# Patient Record
Sex: Male | Born: 1985 | Race: Black or African American | Hispanic: No | Marital: Single | Smoking: Current every day smoker
Health system: Southern US, Community
[De-identification: ages and names within clinical notes are randomized; demographics above are authoritative.]

## PROBLEM LIST (undated history)

## (undated) DIAGNOSIS — F329 Major depressive disorder, single episode, unspecified: Secondary | ICD-10-CM

## (undated) DIAGNOSIS — F909 Attention-deficit hyperactivity disorder, unspecified type: Secondary | ICD-10-CM

## (undated) DIAGNOSIS — F319 Bipolar disorder, unspecified: Secondary | ICD-10-CM

## (undated) DIAGNOSIS — F32A Depression, unspecified: Secondary | ICD-10-CM

## (undated) DIAGNOSIS — C9590 Leukemia, unspecified not having achieved remission: Secondary | ICD-10-CM

## (undated) DIAGNOSIS — F209 Schizophrenia, unspecified: Secondary | ICD-10-CM

## (undated) HISTORY — PX: OTHER SURGICAL HISTORY: SHX169

---

## 2011-05-02 ENCOUNTER — Encounter (HOSPITAL_COMMUNITY): Payer: Self-pay | Admitting: *Deleted

## 2011-05-02 ENCOUNTER — Emergency Department (HOSPITAL_COMMUNITY)
Admission: EM | Admit: 2011-05-02 | Discharge: 2011-05-05 | Disposition: A | Discharge status: Other Acute Inpatient Hospital | Payer: Self-pay | Attending: Emergency Medicine | Admitting: Emergency Medicine

## 2011-05-02 DIAGNOSIS — R45850 Homicidal ideations: Secondary | ICD-10-CM | POA: Insufficient documentation

## 2011-05-02 DIAGNOSIS — F319 Bipolar disorder, unspecified: Secondary | ICD-10-CM | POA: Insufficient documentation

## 2011-05-02 DIAGNOSIS — R45851 Suicidal ideations: Secondary | ICD-10-CM | POA: Insufficient documentation

## 2011-05-02 DIAGNOSIS — F259 Schizoaffective disorder, unspecified: Secondary | ICD-10-CM | POA: Insufficient documentation

## 2011-05-02 DIAGNOSIS — R440 Auditory hallucinations: Secondary | ICD-10-CM

## 2011-05-02 HISTORY — DX: Schizophrenia, unspecified: F20.9

## 2011-05-02 HISTORY — DX: Bipolar disorder, unspecified: F31.9

## 2011-05-02 HISTORY — DX: Major depressive disorder, single episode, unspecified: F32.9

## 2011-05-02 HISTORY — DX: Leukemia, unspecified not having achieved remission: C95.90

## 2011-05-02 HISTORY — DX: Depression, unspecified: F32.A

## 2011-05-02 HISTORY — DX: Attention-deficit hyperactivity disorder, unspecified type: F90.9

## 2011-05-02 LAB — DIFFERENTIAL
Basophils Absolute: 0 10*3/uL (ref 0.0–0.1)
Basophils Relative: 0 % (ref 0–1)
Eosinophils Absolute: 0.1 10*3/uL (ref 0.0–0.7)
Eosinophils Relative: 1 % (ref 0–5)
Lymphocytes Relative: 28 % (ref 12–46)

## 2011-05-02 LAB — BASIC METABOLIC PANEL
Calcium: 9.6 mg/dL (ref 8.4–10.5)
GFR calc Af Amer: >90 mL/min (ref >90)
GFR calc non Af Amer: >90 mL/min (ref >90)
Sodium: 138 mEq/L (ref 135–145)

## 2011-05-02 LAB — URINALYSIS, ROUTINE W REFLEX MICROSCOPIC
Glucose, UA: NEGATIVE mg/dL
Hgb urine dipstick: NEGATIVE
Leukocytes, UA: NEGATIVE
Specific Gravity, Urine: 1.028 (ref 1.005–1.030)

## 2011-05-02 LAB — ETHANOL: Alcohol, Ethyl (B): <11 mg/dL (ref 0–11)

## 2011-05-02 LAB — CBC
MCH: 31.8 pg (ref 26.0–34.0)
MCV: 92.3 fL (ref 78.0–100.0)
Platelets: 331 10*3/uL (ref 150–400)
RDW: 12.6 % (ref 11.5–15.5)
WBC: 12.7 10*3/uL — ABNORMAL HIGH (ref 4.0–10.5)

## 2011-05-02 LAB — RAPID URINE DRUG SCREEN, HOSP PERFORMED: Opiates: NOT DETECTED

## 2011-05-02 MED ORDER — OXYCODONE-ACETAMINOPHEN 5-325 MG PO TABS
2.0000 | ORAL_TABLET | Freq: Once | ORAL | Status: AC
  Administered 2011-05-02: 2 via ORAL
  Filled 2011-05-02: qty 2

## 2011-05-02 MED ORDER — ONDANSETRON HCL 4 MG PO TABS: 4.0000 mg | ORAL_TABLET | Freq: Three times a day (TID) | ORAL | Status: DC | PRN

## 2011-05-02 MED ORDER — HYDROCORTISONE ACETATE 25 MG RE SUPP
25.0000 mg | Freq: Two times a day (BID) | RECTAL | Status: DC
  Administered 2011-05-02 – 2011-05-05 (×5): 25 mg via RECTAL
  Filled 2011-05-02 (×8): qty 1

## 2011-05-02 MED ORDER — ZOLPIDEM TARTRATE 5 MG PO TABS
5.0000 mg | ORAL_TABLET | Freq: Every evening | ORAL | Status: DC | PRN
  Administered 2011-05-02 – 2011-05-04 (×3): 5 mg via ORAL
  Filled 2011-05-02 (×3): qty 1

## 2011-05-02 MED ORDER — LORAZEPAM 1 MG PO TABS: 1.0000 mg | ORAL_TABLET | Freq: Three times a day (TID) | ORAL | Status: DC | PRN

## 2011-05-02 MED ORDER — IBUPROFEN 600 MG PO TABS
600.0000 mg | ORAL_TABLET | Freq: Three times a day (TID) | ORAL | Status: DC | PRN
  Administered 2011-05-03 – 2011-05-04 (×2): 600 mg via ORAL
  Filled 2011-05-02 (×2): qty 1

## 2011-05-02 MED ORDER — ACETAMINOPHEN 325 MG PO TABS: 650.0000 mg | ORAL_TABLET | ORAL | Status: DC | PRN

## 2011-05-02 MED ORDER — RISPERIDONE 0.5 MG PO TBDP
0.5000 mg | ORAL_TABLET | Freq: Two times a day (BID) | ORAL | Status: DC
  Administered 2011-05-02 – 2011-05-03 (×3): 0.5 mg via ORAL
  Filled 2011-05-02 (×4): qty 1

## 2011-05-02 MED ORDER — NICOTINE 21 MG/24HR TD PT24
21.0000 mg | MEDICATED_PATCH | Freq: Every day | TRANSDERMAL | Status: DC
  Administered 2011-05-03 – 2011-05-05 (×3): 21 mg via TRANSDERMAL
  Filled 2011-05-02 (×4): qty 1

## 2011-05-02 MED ORDER — ALUM & MAG HYDROXIDE-SIMETH 200-200-20 MG/5ML PO SUSP: 30.0000 mL | ORAL | Status: DC | PRN

## 2011-05-02 NOTE — ED Notes (Signed)
Pt states "I have untreated bipolar disorder, I started sporadically taking the medicine and then just stopped"; friend with pt states "he was having violent thoughts"

## 2011-05-02 NOTE — ED Notes (Signed)
AC called for sitter. Non-available at present. Preparing patient transfer to psych ED

## 2011-05-02 NOTE — Consult Note (Signed)
Reason for Consult: Psychosis, suicidal and homicidal ideations Referring Physician: Dr. Heron Sabins is an 26 y.o. male.  HPI:  This is a 26 years old young male presented to the [facility] emergency department psychiatric services with the chief complaints of command hallucinations telling him to kill himself, his girlfriend and the years old child. Patient reported he has a past history of for schizoaffective disorder bipolar disorder and was not on medications for the last 6 years. Patient reported he was admitted to the [facility] in [place] for 3 times and [facility] while living in [place] about 5-6 years ago. Patient reported he was also diagnosed with attention deficit hyperactivity disorder and taking Adderall and Wellbutrin in the past. 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 Patient the has a history of using acid, cocaine, ecstasy and marijuana abuse. Patient to drinks alcohol rarely. His toxicology shows marijuana. Patient reported his friend Riki Rusk and his girlfriend brought him up when he's been out of control and acting out at home. Patient did stated that he does not want kill he want to control himself but he cannot control himself at this time and splitting for the help.   Past Medical History  Diagnosis Date  . Bipolar disorder   . ADHD (attention deficit hyperactivity disorder)   . Schizophrenia   . Depression   . Leukemia     Past Surgical History  Procedure Date  . Bone marrow transplant     No family history on file.  Social History:  reports that he has been smoking.  He does not have any smokeless tobacco history on file. He reports that he drinks alcohol. He reports that he uses illicit drugs (Marijuana) about 3 times per week.  Allergies:  No Known Allergies  Medications: I have reviewed the patient's current medications.  Results for orders placed during the hospital encounter of 05/02/11 (from the past 48 hour(s))  CBC     Status: Abnormal   Collection Time   05/02/11  9:04 AM      Component Value Range Comment   WBC 12.7 (*) 4.0 - 10.5 (K/uL)    RBC 4.78  4.22 - 5.81 (MIL/uL)    Hemoglobin 15.2  13.0 - 17.0 (g/dL)    HCT 16.1  09.6 - 04.5 (%)    MCV 92.3  78.0 - 100.0 (fL)    MCH 31.8  26.0 - 34.0 (pg)    MCHC 34.5  30.0 - 36.0 (g/dL)    RDW 40.9  81.1 - 91.4 (%)    Platelets 331  150 - 400 (K/uL)   DIFFERENTIAL     Status: Abnormal   Collection Time   05/02/11  9:04 AM      Component Value Range Comment   Neutrophils Relative 65  43 - 77 (%)    Neutro Abs 8.2 (*) 1.7 - 7.7 (K/uL)    Lymphocytes Relative 28  12 - 46 (%)    Lymphs Abs 3.5  0.7 - 4.0 (K/uL)    Monocytes Relative 6  3 - 12 (%)    Monocytes Absolute 0.8  0.1 - 1.0 (K/uL)    Eosinophils Relative 1  0 - 5 (%)    Eosinophils Absolute 0.1  0.0 - 0.7 (K/uL)  Basophils Relative 0  0 - 1 (%)    Basophils Absolute 0.0  0.0 - 0.1 (K/uL)   BASIC METABOLIC PANEL     Status: Abnormal   Collection Time   05/02/11  9:04 AM      Component Value Range Comment   Sodium 138  135 - 145 (mEq/L)    Potassium 3.8  3.5 - 5.1 (mEq/L)    Chloride 102  96 - 112 (mEq/L)    CO2 26  19 - 32 (mEq/L)    Glucose, Bld 104 (*) 70 - 99 (mg/dL)    BUN 13  6 - 23 (mg/dL)    Creatinine, Ser 1.61  0.50 - 1.35 (mg/dL)    Calcium 9.6  8.4 - 10.5 (mg/dL)    GFR calc non Af Amer >90  >90 (mL/min)    GFR calc Af Amer >90  >90 (mL/min)   ETHANOL     Status: Normal   Collection Time   05/02/11  9:04 AM      Component Value Range Comment   Alcohol, Ethyl (B) <11  0 - 11 (mg/dL)   URINE RAPID DRUG SCREEN (HOSP PERFORMED)     Status: Abnormal   Collection Time   05/02/11  9:15 AM      Component Value Range Comment   Opiates NONE DETECTED  NONE DETECTED     Cocaine NONE DETECTED   NONE DETECTED     Benzodiazepines NONE DETECTED  NONE DETECTED     Amphetamines NONE DETECTED  NONE DETECTED     Tetrahydrocannabinol POSITIVE (*) NONE DETECTED     Barbiturates NONE DETECTED  NONE DETECTED    URINALYSIS, ROUTINE W REFLEX MICROSCOPIC     Status: Normal   Collection Time   05/02/11  9:15 AM      Component Value Range Comment   Color, Urine YELLOW  YELLOW     APPearance CLEAR  CLEAR     Specific Gravity, Urine 1.028  1.005 - 1.030     pH 6.0  5.0 - 8.0     Glucose, UA NEGATIVE  NEGATIVE (mg/dL)    Hgb urine dipstick NEGATIVE  NEGATIVE     Bilirubin Urine NEGATIVE  NEGATIVE     Ketones, ur NEGATIVE  NEGATIVE (mg/dL)    Protein, ur NEGATIVE  NEGATIVE (mg/dL)    Urobilinogen, UA 0.2  0.0 - 1.0 (mg/dL)    Nitrite NEGATIVE  NEGATIVE     Leukocytes, UA NEGATIVE  NEGATIVE  MICROSCOPIC NOT DONE ON URINES WITH NEGATIVE PROTEIN, BLOOD, LEUKOCYTES, NITRITE, OR GLUCOSE <1000 mg/dL.    No results found.  Positive for aggressive behavior, bad mood, bipolar, illegal drug usage, mood swings and command Auditory hallucinations Blood pressure 118/77, pulse 78, temperature 98.5 F (36.9 C), temperature source Oral, resp. rate 16, SpO2 100.00%.   Assessment/Plan: Psychosis not otherwise specified versus schizoaffective disorder Cannabis abuse versus dependence Polysubstance abuse versus dependence by history  Recommended psychiatric hospitalization for safety and stabilization  Vineeth Fell,JANARDHAHA R. 05/02/2011, 12:07 PM

## 2011-05-02 NOTE — BH Assessment (Signed)
Assessment Note   Nevada Crane, Child psychotherapist at [employer], submitted Pt for consideration for inpatient psychiatric treatment at [facility]. Per Laverle Hobby, AC no appropriate bed is available at this time. Pt placed on pending list. Notified Georgina Quint, assessment counselor at [facility], of disposition.    Patsy Baltimore, Harlin Rain 05/02/2011 8:54 PM

## 2011-05-02 NOTE — ED Notes (Signed)
Pt. States that he was up plaging games until 0400 this morning and just about ready to go to bed when his girlfriend's daughter woke up asking for tissues, pt. States that he got her some tissues and began screaming (which is a trigger for him) the screaming woke up his girlfriend and pt. Began hearing voices telling him to go to the kitchen and get a knife and kill his girlfriend, her daughter  And then kill himself with a knife and banging his head against the wall.  States "wants to kill everyone and self and anyone that would stop him so all his problems will be done with and no one would miss me."  Pt. Showed no emotion throughout discussion.

## 2011-05-02 NOTE — ED Provider Notes (Addendum)
History     CSN: [id number]  Arrival date & time 05/02/11  0865   First MD Initiated Contact with Patient 05/02/11 [phone]      Chief Complaint  Patient presents with  . Medical Clearance    (Consider location/radiation/quality/duration/timing/severity/associated sxs/prior treatment) Patient is a 26 y.o. male presenting with mental health disorder. The history is provided by the patient. No language interpreter was used.  Mental Health Problem The primary symptoms include delusions and hallucinations. The primary symptoms do not include dysphoric mood. The current episode started 1 to 2 weeks ago. This is a new problem.  The delusions began more than 2 weeks ago. The delusions appear to be have been worsening since their onset. He has delusions of persecution, being controlled and audible thoughts.  The hallucinations began more than 2 weeks ago. He has auditory hallucinations.  The onset of the illness is precipitated by emotional stress and drug abuse. The degree of incapacity that he is experiencing as a consequence of his illness is moderate. Sequelae of the illness include an inability to work and an inability to care for self. Additional symptoms of the illness do not include no appetite change, no fatigue, no headaches or no abdominal pain. He admits to suicidal ideas. He does have a plan to commit suicide. He does not contemplate harming himself. He has not already injured self. He contemplates injuring another person. He has not already  injured another person. Risk factors that are present for mental illness include a history of mental illness.    Past Medical History  Diagnosis Date  . Bipolar disorder   . ADHD (attention deficit hyperactivity disorder)   . Schizophrenia   . Depression   . Leukemia     Past Surgical History  Procedure Date  . Bone marrow transplant     No family history on file.  History  Substance Use Topics  . Smoking status: Current Everyday Smoker  -- 0.5 packs/day  . Smokeless tobacco: Not on file  . Alcohol Use: Yes     rarely      Review of Systems  Constitutional: Positive for activity change. Negative for fever, chills, appetite change and fatigue.  HENT: Negative for congestion, sore throat, rhinorrhea, neck pain and neck stiffness.   Respiratory: Negative for cough and shortness of breath.   Cardiovascular: Negative for chest pain and palpitations.  Gastrointestinal: Negative for nausea, vomiting and abdominal pain.  Genitourinary: Negative for dysuria, urgency, frequency and flank pain.  Musculoskeletal: Negative for myalgias, back pain and arthralgias.  Neurological: Negative for dizziness, weakness, light-headedness, numbness and headaches.  Psychiatric/Behavioral: Positive for suicidal ideas and hallucinations. Negative for self-injury and dysphoric mood. The patient is not nervous/anxious.   All other systems reviewed and are negative.    Allergies  Review of patient's allergies indicates no known allergies.  Home Medications   Current Outpatient Rx  Name Route Sig Dispense Refill  . IBUPROFEN 200 MG PO TABS Oral Take 400 mg by mouth every 8 (eight) hours as needed. For pain.      BP 119/75  Pulse 95  Temp(Src) 98.5 F (36.9 C) (Oral)  Resp 20  SpO2 97%  Physical Exam  Nursing note and vitals reviewed. Constitutional: He is oriented to person, place, and time. He appears well-developed and well-nourished. No distress.  HENT:  Head: Normocephalic and atraumatic.  Mouth/Throat: Oropharynx is clear and moist.  Eyes: Conjunctivae and EOM are normal. Pupils are equal, round, and reactive to light.  Neck: Normal range of motion. Neck supple.  Cardiovascular: Normal rate, regular rhythm, normal heart sounds and intact distal pulses.  Exam reveals no gallop and no friction rub.   No murmur heard. Pulmonary/Chest: Effort normal and breath sounds normal. No respiratory distress. He exhibits no tenderness.    Abdominal: Soft. Bowel sounds are normal. There is no tenderness. There is no rebound and no guarding.  Musculoskeletal: Normal range of motion. He exhibits no edema and no tenderness.  Neurological: He is alert and oriented to person, place, and time. No cranial nerve deficit.  Skin: Skin is warm and dry. No rash noted.  Psychiatric: His affect is blunt. He is actively hallucinating. Thought content is delusional. He exhibits a depressed mood. He expresses homicidal and suicidal ideation. He expresses suicidal plans and homicidal plans.    ED Course  Procedures (including critical care time)   Labs Reviewed  CBC  DIFFERENTIAL  BASIC METABOLIC PANEL  ETHANOL  URINE RAPID DRUG SCREEN (HOSP PERFORMED)  URINALYSIS, ROUTINE W REFLEX MICROSCOPIC   No results found.   1. Suicidal ideation   2. Homicidal ideation   3. Auditory hallucinations       MDM  [place] auditory hallucinations instructing the patient to harm others and himself. He has specific to liberate plans on how to kill himself and others. This is extremely concerning for the safety of himself and others. Medical screening labs were obtained. He is medically clear for psychiatric evaluation. Discussed with the ACT team. Psychiatric orders were placed. The patient will require admission for psychiatric stabilization.  He is currently here voluntarily.  Called to bedside for painful hemorrhoids.  Examined and there is no thrombosis.  Will prescribe anusol and pain meds        Dayton Bailiff, MD 05/02/11 1610  Dayton Bailiff, MD 05/02/11 1019
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
## 2011-05-02 NOTE — ED Notes (Signed)
Per acute RN, pt.'s girlfriend took pt.'s belongings home.

## 2011-05-03 MED ORDER — BUPROPION HCL ER (XL) 150 MG PO TB24: 75.0000 mg | ORAL_TABLET | Freq: Every day | ORAL | Status: DC

## 2011-05-03 MED ORDER — BUPROPION HCL ER (XL) 150 MG PO TB24
150.0000 mg | ORAL_TABLET | Freq: Every day | ORAL | Status: DC
  Administered 2011-05-03: 150 mg via ORAL
  Filled 2011-05-03 (×2): qty 1

## 2011-05-03 MED ORDER — RISPERIDONE 2 MG PO TBDP
2.0000 mg | ORAL_TABLET | Freq: Two times a day (BID) | ORAL | Status: DC
  Administered 2011-05-03: 2 mg via ORAL
  Filled 2011-05-03 (×4): qty 1
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
## 2011-05-03 NOTE — ED Notes (Signed)
Dr allen into see 

## 2011-05-03 NOTE — ED Notes (Signed)
Up to the desk on the phone 

## 2011-05-03 NOTE — ED Provider Notes (Signed)
Patient presented initially with suicidal and homicidal plans to hurt himself and others. Patient currently denies suicidal ideations. Denies any hallucinations auditory or visual. Patient to have consult by telemetry psychiatry  Toy Baker, MD 05/03/11 [phone]

## 2011-05-03 NOTE — ED Provider Notes (Signed)
telepsych consult complete.  Patient needs inpatient psych stabilization.  Gerhard Munch, MD 05/03/11 [phone]

## 2011-05-03 NOTE — ED Notes (Signed)
Sleeping, easily aroused.  Pt is aware that the telepsy will be done shortly

## 2011-05-03 NOTE — ED Notes (Signed)
telepsych in progress 

## 2011-05-04 MED ORDER — BUPROPION HCL 75 MG PO TABS
75.0000 mg | ORAL_TABLET | Freq: Every day | ORAL | Status: DC
  Administered 2011-05-04 – 2011-05-05 (×2): 75 mg via ORAL
  Filled 2011-05-04 (×2): qty 1

## 2011-05-04 MED ORDER — RISPERIDONE 2 MG PO TABS
2.0000 mg | ORAL_TABLET | Freq: Every day | ORAL | Status: DC
  Administered 2011-05-04: 2 mg via ORAL
  Filled 2011-05-04: qty 1

## 2011-05-04 NOTE — ED Notes (Signed)
Sitting quietly w/ girlfriend watching tv

## 2011-05-04 NOTE — ED Notes (Signed)
Talking quietly w/ girlfriend, watching tv

## 2011-05-04 NOTE — ED Notes (Signed)
Talking quietly w/ girlfriend 
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
## 2011-05-04 NOTE — ED Provider Notes (Signed)
  Physical Exam  BP 127/74  Pulse 65  Temp(Src) 98.3 F (36.8 C) (Oral)  Resp 16  SpO2 99%  Physical Exam  ED Course  Procedures  MDM Telemetry psych consult reviewed. Medications adjusted to comply with the recommendations. Patient is pending behavioral health and possibly the [place]. Patient was sleeping comfortably this morning.      Juliet Rude. Rubin Payor, MD 05/04/11 [phone]

## 2011-05-04 NOTE — ED Notes (Signed)
Patients girlfriend into see

## 2011-05-04 NOTE — ED Notes (Signed)
eating lunch

## 2011-05-05 ENCOUNTER — Inpatient Hospital Stay (HOSPITAL_COMMUNITY)
Admission: AD | Admit: 2011-05-05 | Discharge: 2011-05-09 | DRG: 885 | Disposition: A | Discharge status: Home / Self Care | Payer: PRIVATE HEALTH INSURANCE | Source: Ambulatory Visit | Attending: Psychiatry | Admitting: Psychiatry

## 2011-05-05 ENCOUNTER — Encounter (HOSPITAL_COMMUNITY): Payer: Self-pay

## 2011-05-05 DIAGNOSIS — R45851 Suicidal ideations: Secondary | ICD-10-CM

## 2011-05-05 DIAGNOSIS — F411 Generalized anxiety disorder: Secondary | ICD-10-CM

## 2011-05-05 DIAGNOSIS — R45850 Homicidal ideations: Secondary | ICD-10-CM

## 2011-05-05 DIAGNOSIS — F1210 Cannabis abuse, uncomplicated: Secondary | ICD-10-CM

## 2011-05-05 DIAGNOSIS — F909 Attention-deficit hyperactivity disorder, unspecified type: Secondary | ICD-10-CM | POA: Diagnosis present

## 2011-05-05 DIAGNOSIS — F410 Panic disorder [episodic paroxysmal anxiety] without agoraphobia: Secondary | ICD-10-CM

## 2011-05-05 DIAGNOSIS — F1611 Hallucinogen abuse, in remission: Secondary | ICD-10-CM | POA: Diagnosis present

## 2011-05-05 DIAGNOSIS — F902 Attention-deficit hyperactivity disorder, combined type: Secondary | ICD-10-CM | POA: Diagnosis present

## 2011-05-05 DIAGNOSIS — F259 Schizoaffective disorder, unspecified: Principal | ICD-10-CM | POA: Diagnosis present

## 2011-05-05 DIAGNOSIS — F250 Schizoaffective disorder, bipolar type: Secondary | ICD-10-CM

## 2011-05-05 DIAGNOSIS — F1511 Other stimulant abuse, in remission: Secondary | ICD-10-CM | POA: Diagnosis present

## 2011-05-05 DIAGNOSIS — F1411 Cocaine abuse, in remission: Secondary | ICD-10-CM | POA: Diagnosis present

## 2011-05-05 MED ORDER — NICOTINE 14 MG/24HR TD PT24
MEDICATED_PATCH | TRANSDERMAL | Status: AC
  Administered 2011-05-05: 14 mg via TRANSDERMAL
  Filled 2011-05-05: qty 1

## 2011-05-05 MED ORDER — OXCARBAZEPINE 150 MG PO TABS
150.0000 mg | ORAL_TABLET | Freq: Every day | ORAL | Status: DC
  Administered 2011-05-06: 150 mg via ORAL
  Filled 2011-05-05 (×3): qty 1

## 2011-05-05 MED ORDER — ACETAMINOPHEN 325 MG PO TABS: 650.0000 mg | ORAL_TABLET | Freq: Four times a day (QID) | ORAL | Status: DC | PRN

## 2011-05-05 MED ORDER — HYDROXYZINE HCL 50 MG PO TABS: 50.0000 mg | ORAL_TABLET | Freq: Four times a day (QID) | ORAL | Status: DC | PRN

## 2011-05-05 MED ORDER — TRAZODONE HCL 100 MG PO TABS
100.0000 mg | ORAL_TABLET | Freq: Every day | ORAL | Status: DC
  Administered 2011-05-05 – 2011-05-08 (×4): 100 mg via ORAL
  Filled 2011-05-05 (×3): qty 1
  Filled 2011-05-05: qty 7
  Filled 2011-05-05 (×2): qty 1

## 2011-05-05 MED ORDER — NICOTINE 14 MG/24HR TD PT24
14.0000 mg | MEDICATED_PATCH | TRANSDERMAL | Status: DC
  Administered 2011-05-05 – 2011-05-08 (×4): 14 mg via TRANSDERMAL
  Filled 2011-05-05 (×3): qty 1
  Filled 2011-05-05: qty 7
  Filled 2011-05-05: qty 1

## 2011-05-05 MED ORDER — MAGNESIUM HYDROXIDE 400 MG/5ML PO SUSP: 30.0000 mL | Freq: Every day | ORAL | Status: DC | PRN

## 2011-05-05 MED ORDER — ALUM & MAG HYDROXIDE-SIMETH 200-200-20 MG/5ML PO SUSP: 30.0000 mL | ORAL | Status: DC | PRN

## 2011-05-05 MED ORDER — QUETIAPINE FUMARATE 200 MG PO TABS
200.0000 mg | ORAL_TABLET | Freq: Every day | ORAL | Status: DC
  Filled 2011-05-05 (×2): qty 1

## 2011-05-05 NOTE — Tx Team (Signed)
Initial Interdisciplinary Treatment Plan  PATIENT STRENGTHS: (choose at least two) Average or above average intelligence General fund of knowledge  PATIENT STRESSORS: Financial difficulties Mental Health Disorder   PROBLEM LIST: Problem List/Patient Goals Date to be addressed Date deferred Reason deferred Estimated date of resolution  Psychosis      Homicidal Ideation                                                 DISCHARGE CRITERIA:  Need for constant or close observation no longer present  PRELIMINARY DISCHARGE PLAN: Outpatient therapy  PATIENT/FAMIILY INVOLVEMENT: This treatment plan has been presented to and reviewed with the patient, Anthony Fischer, and/or family member.  The patient and family have been given the opportunity to ask questions and make suggestions.  Anthony Fischer [facility] 05/05/2011, 7:35 PM

## 2011-05-05 NOTE — ED Provider Notes (Signed)
Patient stable here today.  Accepted to behavioral health and being transferred.   Hilario Quarry, MD 05/05/11 [phone]

## 2011-05-05 NOTE — ED Notes (Signed)
Report called to [facility], Charity fundraiser.  BH requested pt. To be sent to them at 1630.  [facility] called for transportation to [facility].
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
## 2011-05-06 DIAGNOSIS — F250 Schizoaffective disorder, bipolar type: Secondary | ICD-10-CM | POA: Diagnosis present

## 2011-05-06 DIAGNOSIS — F259 Schizoaffective disorder, unspecified: Principal | ICD-10-CM

## 2011-05-06 DIAGNOSIS — F410 Panic disorder [episodic paroxysmal anxiety] without agoraphobia: Secondary | ICD-10-CM

## 2011-05-06 DIAGNOSIS — F411 Generalized anxiety disorder: Secondary | ICD-10-CM | POA: Diagnosis present

## 2011-05-06 DIAGNOSIS — F1210 Cannabis abuse, uncomplicated: Secondary | ICD-10-CM | POA: Diagnosis present

## 2011-05-06 DIAGNOSIS — F909 Attention-deficit hyperactivity disorder, unspecified type: Secondary | ICD-10-CM

## 2011-05-06 DIAGNOSIS — F902 Attention-deficit hyperactivity disorder, combined type: Secondary | ICD-10-CM | POA: Diagnosis present

## 2011-05-06 LAB — COMPREHENSIVE METABOLIC PANEL
ALT: 45 U/L (ref 0–53)
Alkaline Phosphatase: 67 U/L (ref 39–117)
CO2: 29 mEq/L (ref 19–32)
GFR calc Af Amer: >90 mL/min (ref >90)
GFR calc non Af Amer: >90 mL/min (ref >90)
Glucose, Bld: 99 mg/dL (ref 70–99)
Potassium: 4.6 mEq/L (ref 3.5–5.1)
Sodium: 140 mEq/L (ref 135–145)

## 2011-05-06 MED ORDER — IBUPROFEN 600 MG PO TABS
600.0000 mg | ORAL_TABLET | Freq: Four times a day (QID) | ORAL | Status: DC | PRN
Start: 2011-05-06 — End: 2011-05-09
  Administered 2011-05-07: 600 mg via ORAL
  Filled 2011-05-06: qty 1

## 2011-05-06 MED ORDER — RISPERIDONE 2 MG PO TABS
2.0000 mg | ORAL_TABLET | Freq: Every day | ORAL | Status: DC
  Administered 2011-05-06 – 2011-05-08 (×3): 2 mg via ORAL
  Filled 2011-05-06 (×5): qty 1
  Filled 2011-05-06: qty 7
  Filled 2011-05-06: qty 1

## 2011-05-06 MED ORDER — OXCARBAZEPINE 150 MG PO TABS
150.0000 mg | ORAL_TABLET | ORAL | Status: DC
  Administered 2011-05-06 – 2011-05-09 (×6): 150 mg via ORAL
  Filled 2011-05-06: qty 1
  Filled 2011-05-06: qty 14
  Filled 2011-05-06 (×4): qty 1
  Filled 2011-05-06: qty 14
  Filled 2011-05-06 (×3): qty 1

## 2011-05-06 MED ORDER — SERTRALINE HCL 50 MG PO TABS
50.0000 mg | ORAL_TABLET | Freq: Every day | ORAL | Status: DC
  Administered 2011-05-07 – 2011-05-08 (×2): 50 mg via ORAL
  Filled 2011-05-06 (×6): qty 1

## 2011-05-06 NOTE — Progress Notes (Signed)
[facility] Group Notes:  (Counselor/Nursing/MHT/Case Management/Adjunct)  05/06/2011 3:56 PM  Type of Therapy:  Group Therapy  Participation Level:  Active  Participation Quality:  Attentive and Sharing  Affect:  Appropriate  Cognitive:  Oriented  Insight:  Limited  Engagement in Group:  Good  Engagement in Therapy:  Good  Modes of Intervention:  Clarification, Education, Problem-solving and Support  Summary of Progress/Problems: Patient talked about dealing with his voices for years and not being on medications because he had gotten mad at someone at [place]. He has been dealing with the voices by using THC. Stated that he could usually push them away but now he was having thoughts of hurting other people. Stated that his girlfriend had gotten him in the hospital, but didn't like how she had done it. Open to medications   Arya Boxley, Aram Beecham 05/06/2011, 3:56 PM

## 2011-05-06 NOTE — Tx Team (Signed)
Interdisciplinary Treatment Plan Update (Adult)  Date:  05/06/2011  Time Reviewed:  10:15AM-11:15AM  Progress in Treatment: Attending groups:  Yes Participating in groups:    Yes, fully engaged Taking medication as prescribed:    Yes Tolerating medication:   Yes, but new, still to be observed Family/Significant other contact made:  Not yet Patient understands diagnosis:   Yes, with limited insight and poor judgment Discussing patient identified problems/goals with staff:   Yes, talks about having a "shoddy memory" Medical problems stabilized or resolved:   Yes Denies suicidal/homicidal ideation:  Yes Issues/concerns per patient self-inventory:   None Other:    New problem(s) identified: No, Describe:    Reason for Continuation of Hospitalization: Hallucinations Medication stabilization  Interventions implemented related to continuation of hospitalization:  Medication monitoring and adjustment, safety checks Q15 min., suicide risk assessment, group therapy, psychoeducation, collateral contact, aftercare planning, ongoing physician assessments, medication education  Additional comments:  Not applicable  Estimated length of stay:  3-4 days  Discharge Plan:  Return to live with his girlfriend and her 3yo daughter.  Follow-up to be established with [facility].  New goal(s):  Not applicable  Review of initial/current patient goals per problem list:   1.  Goal(s):  Reduce auditory and visual hallucinations to manageable level/baseline.  Met:  No  Target date:  By Discharge   As evidenced by:  Voices today are telling him that we are "quacks and can't help me"; however, in his mind does feel we are wanting to help him, and trusts [place]  2.  Goal(s):  Decide if & how to address substance abuse issues.  Met:  No  Target date:  By Discharge   As evidenced by:  Does not feel that this is an issue, as he does not have cravings  3.  Goal(s):  Deny SI & HI for 48 hours prior to  D/C.  Met:  No  Target date:  By Discharge   As evidenced by:  Has not been here 48 hours yet.  4.  Goal(s):  Determine whether CPS report is necessary.  Met:  Yes  Target date:  By Discharge   As evidenced by:  SW Case manager to make report  Attendees: Patient:  Anthony Fischer  05/06/2011 10:15AM-11:15AM  Family:     Physician:  Dr. Harvie Heck Readling 05/06/2011 10:15AM-11:15AM  Nursing:   Neill Loft, RN 05/06/2011 10:15AM -11:15AM   Case Manager:  Ambrose Mantle, LCSW 05/06/2011 10:15AM-11:15AM  Counselor:  Veto Kemps, MT-BC 05/06/2011 10:15AM-11:15AM  Other:   Verne Spurr, PA 05/06/2011 10:15AM-11:15AM  Other:   Rodman Key, RN 05/06/2011   Other:      Other:       Scribe for Treatment Team:   Sarina Ser, 05/06/2011, 10:15AM-11:15AM

## 2011-05-06 NOTE — Progress Notes (Signed)
Patient ID: Anthony Fischer, male   DOB: 09-29-1985, 26 y.o.   MRN: [id number]  Pt. denies lethality and A/V/H's and other problems.  Pt. Is currently visiting with his GF in the [place]-seen hugging her as they sat next to each other.  Pt. Signed a voluntary commitment consent this evening.
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
## 2011-05-06 NOTE — Progress Notes (Signed)
States energy level is low today. Pt,would like to get into an outpt. Program and have a set schedul;e. He hears thoughts in his head that r bad and random and annoying.
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
## 2011-05-06 NOTE — Progress Notes (Signed)
Pt slept throughout night; no s/s of distress noted.

## 2011-05-06 NOTE — Discharge Planning (Signed)
Met with patient in Aftercare Planning Group.   He states he lives with his girlfriend and her 26yo daughter.  He talked for a few moments about taking drugs, citing marijuana particularly, as a means of assisting the voices in his head to not be so mean and not acting on them.    Patient asked for assistance with Case Manager contacting the counselor at [facility] about him missing his classes, and he also needs a letter at D/C for the same reason.  Case Manager, at the direction of the Treatment Team, did a Child Protective Services report to [facility].  Ambrose Mantle, LCSW 05/06/2011, 5:09 PM

## 2011-05-06 NOTE — Progress Notes (Signed)
Patient ID: Anthony Fischer, male   DOB: December 23, 1985, 26 y.o.   MRN: [id number] Pt. denies lethality and A/V/H's or other problems tonight.   Pt. Is cooperative with staff and peers and took his HS meds and went to bed.

## 2011-05-06 NOTE — Progress Notes (Signed)
Pt. States he only slept for two hours due to dreams. Stated he was dreaming about the movie "What Every [place]." and that all the action was happening at one time in one house. C/o stiff neck and back from lying on a bed at [facility] ER. Pt. Contracts for safety and denies SI or HI. Stated he wants to be back on his respiradol to stop having bad dreams. Currently in the dayroom with the other pts.
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
## 2011-05-07 DIAGNOSIS — R45851 Suicidal ideations: Secondary | ICD-10-CM

## 2011-05-07 NOTE — Progress Notes (Signed)
Pt is pleasant and had no complaints. Pt did complain of an headache but was given some motrin. Pt attends groups and actively participates. Pt also feels his medications are working because he could not hear any voices today. Pt was offered support and encouragement.

## 2011-05-07 NOTE — Progress Notes (Signed)
05/07/2011         Time: 0930      Group Topic/Focus: The focus of this group is on enhancing the patient's understanding of leisure, barriers to leisure, and the importance of engaging in positive leisure activities upon discharge for improved total health.  Participation Level: Minimal   Participation Quality: Appropriate and Attentive  Affect: Appropriate  Cognitive: Oriented  Additional Comments: Patient missed much of group as he was meeting with the MD. Patient reports he is no longer hearing voices and was able to identify positive leisure activities he can engage in upon discharge.   Judene Logue 05/07/2011 11:26 AM

## 2011-05-07 NOTE — Discharge Planning (Signed)
Met with patient in Aftercare Planning Group.   He stated that the voices are totally gone today, even though he has been "listening for them deliberately."  Wants to go to [facility] for therapy and psychiatry.  Wants to know his definitive diagnosis prior to D/C so that he can go to appropriate support groups.  Ambrose Mantle, LCSW 05/07/2011, 1:16 PM

## 2011-05-07 NOTE — Progress Notes (Signed)
Patient reported having; "absolutely super day" at the beginning of this shift. Although reported that he continued to hear voices but they were not as loud or bad as they used to be and he tried to ignore them; " like when the doctor spoke with me in the morning, the voices were telling me he is a quark, I knew he wasn't so I just ignored it". He said that his Risperdal helps clear his thoughts but does not help him organize them. He stated that he had history of ADHD and was on Ritalin for years, but his doctor later changed it to Adderal and he was on Adderal till about age 26. Writer encouraged patient to discuss this with the physician in the morning at treatment team. Patient seemed to be doing well, interacting well with peers and staff, med compliant and attending groups.Q 15 minute check continues to maintain safety.

## 2011-05-07 NOTE — Progress Notes (Signed)
Patient's friend and girlfriend visited this evening. Writer assessed patient after his guests left. Patient stated that he was feeling angry and upset because when his girl friend returned home this evening after visit CPS paid her a visit and removed her daughter from there house and took her to her grandma. Pt stated ; "I don't know why they would do something like that, there are some people that does not feed there children, they got to keep them". Writer told patient it was because of the safety of the patient , CPS takes every threat  to kids seriously and that it's about protecting the child. Writer encouraged patient not to focus on that and to focus more of self and on getting better.

## 2011-05-07 NOTE — Progress Notes (Signed)
Patient ID: Anthony Fischer, male   DOB: 1985-01-25, 26 y.o.   MRN: [id number] Pt. was asleep until called for supper:denies lethality and A/V/H's or other problems.  Pt. went to join peers for supper.
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
## 2011-05-07 NOTE — Progress Notes (Signed)
[facility] Adult Inpatient Family/Significant Other Suicide Prevention Education  Suicide Prevention Education:  Education Completed; Anthony Fischer [phone]) has been identified by the patient as the family member/significant other with whom the patient will be residing, and identified as the person(s) who will aid the patient in the event of a mental health crisis (suicidal ideations/suicide attempt).  With written consent from the patient, the family member/significant other has been provided the following suicide prevention education, prior to the and/or following the discharge of the patient.  The suicide prevention education provided includes the following:  Suicide risk factors  Suicide prevention and interventions  National Suicide Hotline telephone number  [facility] assessment telephone number  [facility] Emergency Assistance 911  [facility] and/or Residential Mobile Crisis Unit telephone number  Request made of family/significant other to:  Remove weapons (e.g., guns, rifles, knives), all items previously/currently identified as safety concern.    Remove drugs/medications (over-the-counter, prescriptions, illicit drugs), all items previously/currently identified as a safety concern.  The family member/significant other verbalizes understanding of the suicide prevention education information provided.  The family member/significant other agrees to remove the items of safety concern listed above.  She reported that patient has never done anything to hurt them. He doesn't have any weapons. When asked about her kitchen knives, she stated that she is always with him and her daughter. Not concerned about this.  When discussing warning signs, risks, etc., she stopped counselor and stated that she already knew all of this because she also suffers from depression. Counselor continued to review steps that she could take if she became concerned about his behavior. Again she  expressed no concerns despite him having numerous risk factors.  Anthony Fischer, Anthony Fischer 05/07/2011, 3:20 PM

## 2011-05-07 NOTE — Progress Notes (Signed)
Adult Services Patient-Family Contact/Session  Attendees:  Patient's girlfriend Tobi Bastos [phone])  Goal(s):  Discharge planning  Safety Concerns:  none  Narrative: Tobi Bastos confirmed that patient can return to her home. She has no safety concerns. She stated he has never done anything to harm them. She stated that when he told her about his thoughts to hurt them she brought him to the hospital.  Told her that CPS had been contacted by the case manager due to circumstances that brought patient into hospital. She had not been contacted by them but would let know if patient could not come back to house. She continued to not have safety concerns. Concerned about him getting back to school. Wants a note for [employer].     Barrier(s):  None    Interventions:  Discharge planning  Recommendation(s):  Outpatient follow up and medications  Follow-up Required:  No  Explanation:    Veto Kemps 05/07/2011, 3:15 PM

## 2011-05-08 MED ORDER — SERTRALINE HCL 50 MG PO TABS
75.0000 mg | ORAL_TABLET | Freq: Every day | ORAL | Status: DC
  Administered 2011-05-09: 75 mg via ORAL
  Filled 2011-05-08 (×3): qty 1

## 2011-05-08 NOTE — Progress Notes (Deleted)
Patient ID: Anthony Fischer, male   DOB: September 17, 1985, 26 y.o.   MRN: [id number]  [facility] MD Progress Note                         Hospital stay day #3  Axis I: Schizoaffective Disorder  The patient was seen today and reports the following:  Sleep: 9:30-6 AM Appetite: "good{ Mild>(1-10) >Severe  Hopelessness (1-10):0 "I am hopeful for the future." Depression (1-10): 0 Anxiety (1-10): 0 Suicidal Ideation: . 0 Plan: no Intent: no Means: no Homicidal Ideation: no Plan: no Intent: no Means: no Eye Contact: Good.  General Appearance/Behavior: disheveled  Motor Behavior: no Speech: normal rate, rhythm  Mental Status: oriented x3 Level of Consciousness: awake, alert  Mood: positive Affect: congruent Anxiety: 0 Thought Process: Denies AH "the last was prior to the risperidal." Thought Content: Normal Perception: Judgment: Insight: Cognition:  Sleep: Number of Hours: 8+   Filed Vitals:   05/07/2011 0700  BP: 119/84   Pulse: 79  Temp: 96.  Resp:        . nicotine  14 mg Transdermal Q24H  . OXcarbazepine  150 mg Oral BH-qamhs  . risperiDONE  2 mg Oral QHS  . sertraline  50 mg Oral Daily  . traZODone  100 mg Oral QHS    No results found for this or any previous visit (from the past 48 hour(s)).  Treatment Plan Summary:  1. Daily contact with patient to assess and evaluate symptoms and progress in treatment.  2. Medication management  3. The patient will deny suicidal ideations or homicidal ideations for 48 hours prior to discharge and have a depression and anxiety rating of 3 or less. The patient will also deny any auditory or visual hallucinations or delusional thinking.  4. The patient will deny any symptoms of substance withdrawal at time of discharge.  Plan:  1. Will continue the patient on the current medication.  No changes at this time. 2. Will continue the medication Trazodone 50 mgs po qhs for sleep. Consider 1/2 to 1/4th dose if AM sedation continues. 5. Laboratory  studies reviewed.  6. Will continue to monitor.  Rona Ravens. Krithi Bray [facility] 05/07/2011

## 2011-05-08 NOTE — Progress Notes (Signed)
[facility] MD Progress Note  05/08/2011 4:31 PM  Diagnosis:  Axis I: Schizoaffective Disorder - Bipolar Type.  Generalized Anxiety Disorder.  Panic Disorder without Agoraphobia.  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  Cannabis Abuse - Continuous Usage.  History of Polysubstance Abuse - Cocaine, Ecstasy and LSD - Reportedly in Remission.   The patient was seen today and reports the following:   ADL's: Intact.  Sleep: The patient reports to having some difficulty sleeping last night after hearing that his girlfriend's daughter was removed from the home by CPS.   Appetite: The patient reports a good appetite today.   Mild>(1-10) >Severe  Hopelessness (1-10): 0  Depression (1-10): 5 Anxiety (1-10): 4   Suicidal Ideation: The patient adamantly denies any current suicidal ideations today.  Plan: No  Intent: No  Means: No   Homicidal Ideation: The patient adamantly denies any homicidal ideations today.  Plan: No  Intent: No.  Means: No   General Appearance/Behavior: The patient was friendly and cooperative with this provider.  Eye Contact: Good.  Speech: Appropriate in rate and volume with no pressuring noted today.  Motor Behavior: wnl.  Level of Consciousness: Alert and Oriented x 3.  Mental Status: Alert and Oriented x 3.  Mood: Moderately depressed today.  Affect: Moderately constricted.  Anxiety Level: Moderate anxiety reported today.  Thought Process: wnl.  Thought Content: The patient denies any current auditory or visual hallucinations. He also denies any current paranoid thinking.  Perception:. wnl.  Judgment: Fair to Good.  Insight: Fair to Good.  Cognition: Oriented to person, place and time.  Sleep:  Number of Hours: 5.25    Vital Signs:Blood pressure 119/84, pulse 92, temperature 98.1 F (36.7 C), temperature source Oral, resp. rate 16.  Review of Systems: Neurological:  The patient denies any headaches, dizziness or other neurological complaints  today. G.I.:  The patient denies any stomach upset, constipation or diarrhea. Musculoskeletal:  The patient reports some back pain related to the beds on the unit but no other concerns.  Current Medications: Current Facility-Administered Medications  Medication Dose Route Frequency Provider Last Rate Last Dose  . alum & mag hydroxide-simeth (MAALOX/MYLANTA) 200-200-20 MG/5ML suspension 30 mL  30 mL Oral Q4H PRN Mickeal Skinner, MD      . hydrOXYzine (ATARAX/VISTARIL) tablet 50 mg  50 mg Oral Q6H PRN Mickeal Skinner, MD      . ibuprofen (ADVIL,MOTRIN) tablet 600 mg  600 mg Oral Q6H PRN Curlene Labrum Jmichael Gille, MD   600 mg at 05/07/11 0912  . magnesium hydroxide (MILK OF MAGNESIA) suspension 30 mL  30 mL Oral Daily PRN Mickeal Skinner, MD      . nicotine (NICODERM CQ - dosed in mg/24 hours) patch 14 mg  14 mg Transdermal Q24H Curlene Labrum Morocco Gipe, MD   14 mg at 05/07/11 2139  . OXcarbazepine (TRILEPTAL) tablet 150 mg  150 mg Oral BH-qamhs Curlene Labrum Latrail Pounders, MD   150 mg at 05/08/11 1004  . risperiDONE (RISPERDAL) tablet 2 mg  2 mg Oral QHS Curlene Labrum Zoey Bidwell, MD   2 mg at 05/07/11 2139  . sertraline (ZOLOFT) tablet 75 mg  75 mg Oral Daily Curlene Labrum Shamus Desantis, MD      . traZODone (DESYREL) tablet 100 mg  100 mg Oral QHS Curlene Labrum Ruari Duggan, MD   100 mg at 05/07/11 2139  . DISCONTD: sertraline (ZOLOFT) tablet 50 mg  50 mg Oral Daily Curlene Labrum Purva Vessell, MD   50 mg at 05/08/11 [phone]  Lab Results: No results found for this or any previous visit (from the past 48 hour(s)).  Time was spent today discussing with the patient his current symptoms.  The patient reports to having some difficulty sleeping last night secondary to finding out that his girlfriend's daughter was removed from the home by CPS.  He reports prior to this he was sleeping well.  He reports a good appetite today and reports moderate feelings of sadness, anhedonia and depressed mood.  The patient adamantly denies any suicidal or homicidal ideations today.  He also  denies any auditory or visual hallucinations or delusional thinking today.  The patient states that he would like to be discharged as soon as possible to manage his personal issues.  He denies any medication related side effects today.  Treatment Plan Summary:  1. Daily contact with patient to assess and evaluate symptoms and progress in treatment.  2. Medication management  3. The patient will deny suicidal ideations or homicidal ideations for 48 hours prior to discharge and have a depression and anxiety rating of 3 or less. The patient will also deny any auditory or visual hallucinations or delusional thinking.  4. The patient will deny any symptoms of substance withdrawal at time of discharge.   Plan:  1. Will increase the medication Zoloft to 75 mgs po q am for depression, anxiety and panic symptoms.  2. Will continue the medication Trileptal at 150 mgs po q am and hs for mood stabilization.  3. Will continue the medication Risperdal at 2 mgs po qhs for psychosis.  4. Will continue the medication Trazodone at 100 mgs po qhs for sleep.  5. Laboratory studies reviewed.  6. Will continue to monitor.  7. Possible discharge tomorrow.  Bryston Colocho 05/08/2011, 4:31 PM

## 2011-05-08 NOTE — Progress Notes (Signed)
Pt up in the hallway with c/o dry sinus and difficulty breathing.  Gave saline to clear nasal passage and assisted pt in elevating the head of his bed to help with breathing.  Pt had no other complaints.  Pt returned to bed.  Safety maintained with q15 minute checks.

## 2011-05-08 NOTE — Progress Notes (Signed)
Patient ID: Anthony Fischer, male DOB: Mar 15, 1985, 26 y.o. MRN: [id number]  [facility] MD Progress Note  Hospital stay day #3  Axis I: Schizoaffective Disorder  The patient was seen today and reports the following:  Sleep: 9:30-6 AM  Appetite: "good{  Mild>(1-10) >Severe  Hopelessness (1-10):0 "I am hopeful for the future."  Depression (1-10): 0  Anxiety (1-10): 0  Suicidal Ideation: . 0  Plan: no  Intent: no  Means: no  Homicidal Ideation: no  Plan: no  Intent: no  Means: no  Eye Contact: Good.  General Appearance/Behavior: disheveled  Motor Behavior: no  Speech: normal rate, rhythm  Mental Status: oriented x3  Level of Consciousness: awake, alert  Mood: positive  Affect: congruent  Anxiety: 0  Thought Process: Denies AH "the last was prior to the risperidal."  Thought Content: Normal  Perception:  Judgment:  Insight:  Cognition:  Sleep: Number of Hours: 8+    Filed Vitals:    05/07/2011 0700   BP:  119/84   Pulse:  79   Temp:  96.   Resp:      Current Meds: .  nicotine  14 mg  Transdermal  Q24H   .  OXcarbazepine  150 mg  Oral  BH-qamhs   .  risperiDONE  2 mg  Oral  QHS   .  sertraline  50 mg  Oral  Daily   .  traZODone  100 mg  Oral  QHS    No results found for this or any previous visit (from the past 48 hour(s)).   Treatment Plan Summary:  1. Daily contact with patient to assess and evaluate symptoms and progress in treatment.  2. Medication management  3. The patient will deny suicidal ideations or homicidal ideations for 48 hours prior to discharge and have a depression and anxiety rating of 3 or less. The patient will also deny any auditory or visual hallucinations or delusional thinking.  4. The patient will deny any symptoms of substance withdrawal at time of discharge.  Plan:  1. Will continue the patient on the current medication. No changes at this time.  2. Will continue the medication Trazodone 50 mgs po qhs for sleep. Consider 1/2 to 1/4th dose if AM  sedation continues.  5. Laboratory studies reviewed.  6. Will continue to monitor.   05/07/2011

## 2011-05-08 NOTE — Discharge Planning (Signed)
Met with patient in Aftercare Planning Group.   He was calm, but did report crying himself to sleep last night.  He had a visit from a Doctor, general practice for the [employer] of Social Services, based on the CPS report called in by Case Manager at the direction of Treatment team.  He also talked to his girlfriend, who had a similar visit from CPS.  At that time, the 26yo daughter [name] was removed temporarily from the custody of patient's girlfriend [name], and was placed with the grandparents, [name]'s parents.  This was due, he reported they told him, to [facility] allowing patient to be around the girl even though she knew about his condition, and knew he was not receiving treatment.  During the visit he received from CPS worker, he was informed that he cannot live in the same household as the little girl for now.  He is already making arrangements to go to another friend's home for the summer, if necessary.  He stated that they are a small happy family and he did not think this whole action was necessary.  However, he said it calmly and without any emotion at all.  He asked numerous questions about what he will have to do to allow the girlfriend to regain physical custody of her daughter.  Case Manager consistently pointed him back to working with DSS in the reunification plan, and informed him that reunification will be the top priority of the agency, with permanent removal only be used as a last resort when parents are non-compliant with plan.  Encouraged him that his participation in a variety of treatment methods such as medication management, individual therapy, group classes for skill-building, and support groups, will help him to both build skills for managing the child as well as show DSS seriousness of his intent.  Patient was also very concerned about doing an application for disability.  He stated he has done this in the past, but never followed up.  Case Manager referred him to use advocates or  attorneys to help him.  He already has attorney names, but asked for advocate names.  CM will look up to see if several are available to give him, but will not give him just one due to not wanting to influence his decision.  No case management needs today.  Anthony Mantle, LCSW 05/08/2011, 4:59 PM

## 2011-05-08 NOTE — Progress Notes (Signed)
Patient up and visible in the milieu today.  Tolerating medications well.  Denies suicidal ideations.  Denies auditory hallucinations.  Appetite good.  Alert and oriented.  Interacting well with staff and peers.

## 2011-05-09 MED ORDER — SERTRALINE HCL 25 MG PO TABS: 75.0000 mg | ORAL_TABLET | Freq: Every day | ORAL | Status: AC

## 2011-05-09 MED ORDER — TRAZODONE HCL 100 MG PO TABS: 100.0000 mg | ORAL_TABLET | Freq: Every day | ORAL | Status: AC

## 2011-05-09 MED ORDER — OXCARBAZEPINE 150 MG PO TABS: 150.0000 mg | ORAL_TABLET | ORAL | Status: AC

## 2011-05-09 MED ORDER — RISPERIDONE 2 MG PO TABS: 2.0000 mg | ORAL_TABLET | Freq: Every day | ORAL | Status: AC

## 2011-05-09 MED ORDER — NICOTINE 14 MG/24HR TD PT24: 1.0000 | MEDICATED_PATCH | TRANSDERMAL | Status: AC

## 2011-05-09 MED ORDER — SERTRALINE HCL 25 MG PO TABS
75.0000 mg | ORAL_TABLET | Freq: Every day | ORAL | Status: DC
  Filled 2011-05-09: qty 21
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
## 2011-05-09 NOTE — Discharge Summary (Signed)
Physician Discharge Summary Note  Patient:  Anthony Fischer is an 26 y.o., male MRN:  [id number] DOB:  [birth year] Patient phone:  [phone] (home)  Patient address:   [address] [place] [address],   Date of Admission:  05/05/2011 Date of Discharge: 05/09/2011  Reason for Admission: Schizoaffective disorder bipolar type with suicidal ideation, homicidal ideation and psychosis  Discharge Diagnoses: Principal Problem:  *Schizoaffective disorder, bipolar type Active Problems:  Panic disorder without agoraphobia  Generalized anxiety disorder  ADHD (attention deficit hyperactivity disorder), combined type  Cannabis abuse   Axis Diagnosis:  Discharge Diagnoses:  AXIS I: Schizoaffective Disorder - Bipolar Type.  Generalized Anxiety Disorder.  Panic Disorder without Agoraphobia.  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  Cannabis Abuse - Continuous Usage.  History of Polysubstance Abuse - Cocaine, Ecstasy and LSD - Reportedly in Remission.  AXIS II: Deferred.  AXIS III: 1. H/O Leukemia.  AXIS IV: Chronic Mental Illnesses. Past Serious Medical Issues. Difficulty with CPS issues.  AXIS V: GAF at time of admission approximately 30. GAF at time of discharge approximately 55.    Level of Care:  OP  Hospital Course:  Ahkeem was admitted for crisis management and stabilization from the [facility].  He reports becoming more irritable and unstable over the previous two weeks.  He noted he was having AH and VH, with plans to kill everyone around him including himself with a knife.  He was asleep when his girlfriend's 8 year old daughter woke him up for a tissue.  He became irritated and his girlfriend brought him to the ED.  He was placed under IVC.  Yul stated he has been off of his psych meds for several years, preferring to smoke pot instead.    He was restarted on Trazodone, Trileptal, risperdone 2mg  tablet at bedtime, and sertraline for his depression.  Willi noted  that he slept very well the first night and reported a good reduction in symptoms after he woke up.  He reported no side effects to the medication.  He participated in group and unit programming and worked closely with the Sports coach.  Due to the concern for the girl friend's 62 year old daughter, a report to CPS was made and an investigation was done at the home by DSS. A DSS worker also came to the hospital and interviewed him as well. The little girl went to stay with her maternal grandparents as a result.  Rease worked very well with the Immunologist and was cooperative and supportive.      On the day of discharge he felt well and was ready to go home.  He was evaluated by the treatment team and felt to be safe for discharge. Consults:  None  Significant Diagnostic Studies:  labs: see H&P  Discharge Vitals:   Blood pressure 106/69, pulse 108, temperature 97.8 F (36.6 C), temperature source Oral, resp. rate 20.  Mental Status Exam: See Mental Status Examination and Suicide Risk Assessment completed by Attending Physician prior to discharge.  Discharge destination:  Home  Is patient on multiple antipsychotic therapies at discharge:  No   Has Patient had three or more failed trials of antipsychotic monotherapy by history:  No  Recommended Plan for Multiple Antipsychotic Therapies: Not applicable  Discharge Orders    Future Orders Please Complete By Expires   Diet - low sodium heart healthy      Increase activity slowly      Discharge instructions  Comments:   Take all medications as prescribed.  Keep all follow up appointments as scheduled to get your refills.     Medication List  As of 05/09/2011 10:59 AM   STOP taking these medications         ibuprofen 200 MG tablet         TAKE these medications      Indication    nicotine 14 mg/24hr patch   Commonly known as: NICODERM CQ - dosed in mg/24 hours   Place 1 patch onto the skin daily. For smoking cessation.     Indication: Nicotine Addiction      OXcarbazepine 150 MG tablet   Commonly known as: TRILEPTAL   Take 1 tablet (150 mg total) by mouth 2 (two) times daily in the am and at bedtime.. For mood stabilization.       risperiDONE 2 MG tablet   Commonly known as: RISPERDAL   Take 1 tablet (2 mg total) by mouth at bedtime. For mental clarity.    Indication: Manic-Depression      sertraline 25 MG tablet   Commonly known as: ZOLOFT   Take 3 tablets (75 mg total) by mouth daily. For anxiety and depression.    Indication: Anxiety Disorder      traZODone 100 MG tablet   Commonly known as: DESYREL   Take 1 tablet (100 mg total) by mouth at bedtime. For sleep.    Indication: Trouble Sleeping           Follow-up Information    Follow up with Dr. Eulah Pont on 05/14/2011. (9am appointment for psychiatric evaluation)    Contact information:   [facility] [address]  [address] Telephone:  [phone]         Follow-up recommendations:  Activity:  unlimited Diet:  heart healthy  Comments: None  Signed: Lloyd Huger T. Zanya Lindo PAC For Dr. Harvie Heck D. Readling 05/09/2011, 10:59 AM

## 2011-05-09 NOTE — Tx Team (Signed)
Interdisciplinary Treatment Plan Update (Adult)  Date:  05/09/2011  Time Reviewed:  10:15AM-11:15AM  Progress in Treatment: Attending groups:  Yes Participating in groups:    Yes Taking medication as prescribed:    Yes Tolerating medication:   Yes Family/Significant other contact made:  Yes Patient understands diagnosis:   Yes Discussing patient identified problems/goals with staff:   Yes Medical problems stabilized or resolved:   Yes Denies suicidal/homicidal ideation:  Yes Issues/concerns per patient self-inventory:   None Other:    New problem(s) identified: No, Describe:    Reason for Continuation of Hospitalization: None  Interventions implemented related to continuation of hospitalization:  Medication monitoring and adjustment, safety checks Q15 min., suicide risk assessment, group therapy, psychoeducation, collateral contact, aftercare planning, ongoing physician assessments, medication education - UNTIL DISCHARGE  Additional comments:  Not applicable  Estimated length of stay:  Discharge today  Discharge Plan:  Leave by bus, go back to his home with girlfriend, follow up with [facility].  Go to [employer] and to support groups, information having been provided to him.  New goal(s):  Not applicable  Review of initial/current patient goals per problem list:   1.  Goal(s):  Reduce auditory and visual hallucinations to manageable level/baseline.  Met:  Yes  Target date:  By Discharge   As evidenced by:  Says there are none today  2.  Goal(s):  Decide if & how to address substance abuse issues.  Met:  Yes  Target date:  By Discharge   As evidenced by:  Support groups, [facility], stay on meds  3.  Goal(s):  Deny SI & HI for 48 hours prior to D/C.  Met:  Yes  Target date:  By Discharge   As evidenced by:  Denies adamantly  4.  Goal(s):  Determine whether CPS report is necessary.  Met:  Yes  Target date:  By Discharge   As evidenced by:  CPS  report made and child has been removed, will not be in the home when he gets there  Attendees: Patient:  Anthony Fischer  05/09/2011 10:15AM-11:15AM  Family:     Physician:  Dr. Harvie Heck Readling 05/09/2011 10:15AM-11:15AM  Nursing:   Waynetta Sandy, RN 05/09/2011 10:15AM -11:15AM   Case Manager:  Ambrose Mantle, LCSW 05/09/2011 10:15AM-11:15AM  Counselor:  Veto Kemps, MT-BC 05/09/2011 10:15AM-11:15AM  Other:   Verne Spurr, PA 05/09/2011 10:15AM-11:15AM  Other:   Shelda Jakes, RN 05/09/2011   Other:      Other:       Scribe for Treatment Team:   Sarina Ser, 05/09/2011, 10:15AM-11:15AM

## 2011-05-09 NOTE — Progress Notes (Signed)
[facility] Case Management Discharge Plan:  Will you be returning to the same living situation after discharge: No.  Will return to live with girlfriend but 26yo has been removed by CPS At discharge, do you have transportation home?:Yes,  Provided wth bus pass and will go with girlfriend Do you have the ability to pay for your medications:No.  Is going to [facility] for assistance with medications.  Interagency Information:     Release of information consent forms completed and in the chart;  Patient's signature needed at discharge.  Patient to Follow up at:  Follow-up Information    Follow up with Dr. Eulah Pont on 05/14/2011. (9am appointment for psychiatric evaluation)    Contact information:   [facility] [address] [place]  [address] Telephone:  [phone]      Follow up with [facility]. (Classes are Monday-Thursday 10am-11:30am)    Contact information:   [address] [place] Telephone:  [phone]         Patient denies SI/HI:   Yes,      Safety Planning and Suicide Prevention discussed:  Yes,    During Aftercare Planning Group, Case Manager provided psychoeducation on "Suicide Prevention Information."  This included descriptions of risk factors for suicide, warning signs that an individual is in crisis and thinking of suicide, and what to do if this occurs.  Pt indicated understanding of information provided, and will read brochure given upon discharge.     Barrier to discharge identified:No.  Summary and Recommendations:  Cooperate with DSS about reestablishing contact with child.  Follow-up for groups and medication management.   Sarina Ser 05/09/2011, 1:31 PM

## 2011-05-09 NOTE — Progress Notes (Signed)
05/09/2011  Nursing DC 1430 MD DC"d  Pt per computer order. AVS reviewed with pt, pt signed consents for release of info and given sample meds, DC f/u paperwork as well as all belongings that were previously in locker. Pt denied SI, HI, presence of audit, vis, tactile halluc and states he will be compliant with f/u plan. Pt escorted GF to drive him home PD RN [facility]

## 2011-05-09 NOTE — Progress Notes (Signed)
Patient appeared to be more hopeful and less agitated today. He reported feeling better and that the situation at home will get better. Patient denied SI/HI and denied hallucination and said he would be going home tomorrow. Pt received his HS medications without difficulty. Q 15 minute check continues as ordered.

## 2011-05-13 NOTE — Progress Notes (Signed)
Patient Discharge Instructions:  After Visit Summary (AVS):   Faxed to:  05/13/2011 Face Sheet:   Faxed to:  05/13/2011 Psychiatric Admission Assessment Note:   Faxed to:  05/13/2011 Suicide Risk Assessment - Discharge Assessment:   Faxed to:  05/13/2011 Faxed/Sent to the Next Level Care provider:  05/13/2011  Faxed to [facility] - Dr. Eulah Pont @ [phone]  Wandra Scot, 05/13/2011, 4:58 PM

## 2013-01-27 DIAGNOSIS — Z856 Personal history of leukemia: Secondary | ICD-10-CM | POA: Insufficient documentation

## 2013-01-27 DIAGNOSIS — Z79899 Other long term (current) drug therapy: Secondary | ICD-10-CM | POA: Insufficient documentation

## 2013-01-27 DIAGNOSIS — F209 Schizophrenia, unspecified: Secondary | ICD-10-CM | POA: Insufficient documentation

## 2013-01-27 DIAGNOSIS — F319 Bipolar disorder, unspecified: Secondary | ICD-10-CM | POA: Insufficient documentation

## 2013-01-27 DIAGNOSIS — F909 Attention-deficit hyperactivity disorder, unspecified type: Secondary | ICD-10-CM | POA: Insufficient documentation

## 2013-01-27 DIAGNOSIS — F17200 Nicotine dependence, unspecified, uncomplicated: Secondary | ICD-10-CM | POA: Insufficient documentation

## 2013-01-27 DIAGNOSIS — R1032 Left lower quadrant pain: Secondary | ICD-10-CM | POA: Insufficient documentation

## 2013-01-28 ENCOUNTER — Emergency Department (HOSPITAL_COMMUNITY): Payer: Medicaid Other

## 2013-01-28 ENCOUNTER — Encounter (HOSPITAL_COMMUNITY): Payer: Self-pay | Admitting: Emergency Medicine

## 2013-01-28 ENCOUNTER — Emergency Department (HOSPITAL_COMMUNITY)
Admission: EM | Admit: 2013-01-28 | Discharge: 2013-01-28 | Disposition: A | Discharge status: Home / Self Care | Payer: Medicaid Other | Attending: Emergency Medicine | Admitting: Emergency Medicine

## 2013-01-28 DIAGNOSIS — R109 Unspecified abdominal pain: Secondary | ICD-10-CM

## 2013-01-28 LAB — COMPREHENSIVE METABOLIC PANEL
ALBUMIN: 3.9 g/dL (ref 3.5–5.2)
ALT: 75 U/L — ABNORMAL HIGH (ref 0–53)
AST: 36 U/L (ref 0–37)
Alkaline Phosphatase: 80 U/L (ref 39–117)
BUN: 12 mg/dL (ref 6–23)
CO2: 26 mEq/L (ref 19–32)
CREATININE: 0.91 mg/dL (ref 0.50–1.35)
Calcium: 9.6 mg/dL (ref 8.4–10.5)
Chloride: 104 mEq/L (ref 96–112)
GFR calc non Af Amer: >90 mL/min (ref >90)
Glucose, Bld: 93 mg/dL (ref 70–99)
Potassium: 4.2 mEq/L (ref 3.7–5.3)
Sodium: 142 mEq/L (ref 137–147)
TOTAL PROTEIN: 7.6 g/dL (ref 6.0–8.3)
Total Bilirubin: <0.2 mg/dL — ABNORMAL LOW (ref 0.3–1.2)

## 2013-01-28 LAB — CBC WITH DIFFERENTIAL/PLATELET
BASOS PCT: 1 % (ref 0–1)
Basophils Absolute: 0.1 10*3/uL (ref 0.0–0.1)
EOS ABS: 0.3 10*3/uL (ref 0.0–0.7)
EOS PCT: 2 % (ref 0–5)
HEMATOCRIT: 44.6 % (ref 39.0–52.0)
HEMOGLOBIN: 15.5 g/dL (ref 13.0–17.0)
Lymphocytes Relative: 46 % (ref 12–46)
Lymphs Abs: 5.4 10*3/uL — ABNORMAL HIGH (ref 0.7–4.0)
MCH: 32.4 pg (ref 26.0–34.0)
MCHC: 34.8 g/dL (ref 30.0–36.0)
MCV: 93.3 fL (ref 78.0–100.0)
MONO ABS: 0.8 10*3/uL (ref 0.1–1.0)
MONOS PCT: 7 % (ref 3–12)
Neutro Abs: 5.3 10*3/uL (ref 1.7–7.7)
Neutrophils Relative %: 44 % (ref 43–77)
Platelets: 328 10*3/uL (ref 150–400)
RBC: 4.78 MIL/uL (ref 4.22–5.81)
RDW: 12.4 % (ref 11.5–15.5)
WBC: 11.9 10*3/uL — ABNORMAL HIGH (ref 4.0–10.5)

## 2013-01-28 LAB — URINALYSIS, ROUTINE W REFLEX MICROSCOPIC
Bilirubin Urine: NEGATIVE
Glucose, UA: NEGATIVE mg/dL
Hgb urine dipstick: NEGATIVE
KETONES UR: NEGATIVE mg/dL
LEUKOCYTES UA: NEGATIVE
NITRITE: NEGATIVE
PH: 5.5 (ref 5.0–8.0)
Protein, ur: NEGATIVE mg/dL
SPECIFIC GRAVITY, URINE: 1.027 (ref 1.005–1.030)
UROBILINOGEN UA: 0.2 mg/dL (ref 0.0–1.0)

## 2013-01-28 LAB — LIPASE, BLOOD: LIPASE: 18 U/L (ref 11–59)

## 2013-01-28 MED ORDER — ONDANSETRON 4 MG PO TBDP
8.0000 mg | ORAL_TABLET | Freq: Once | ORAL | Status: AC
  Administered 2013-01-28: 8 mg via ORAL
  Filled 2013-01-28: qty 2

## 2013-01-28 MED ORDER — DICYCLOMINE HCL 20 MG PO TABS: 20.0000 mg | ORAL_TABLET | Freq: Four times a day (QID) | ORAL | Status: AC | PRN

## 2013-01-28 MED ORDER — IOHEXOL 300 MG/ML  SOLN
100.0000 mL | Freq: Once | INTRAMUSCULAR | Status: AC | PRN
  Administered 2013-01-28: 100 mL via INTRAVENOUS

## 2013-01-28 MED ORDER — OXYCODONE-ACETAMINOPHEN 5-325 MG PO TABS
1.0000 | ORAL_TABLET | Freq: Once | ORAL | Status: AC
  Administered 2013-01-28: 1 via ORAL
  Filled 2013-01-28: qty 1

## 2013-01-28 NOTE — Discharge Instructions (Signed)
Your workup has not shown a specific cause for your symptoms.  Drink plenty of water.  Take medication as prescribed.  Return to the ER for worsening condition or new concerning symptoms.   Abdominal Pain, Adult Many things can cause abdominal pain. Usually, abdominal pain is not caused by a disease and will improve without treatment. It can often be observed and treated at home. Your health care provider will do a physical exam and possibly order blood tests and X-rays to help determine the seriousness of your pain. However, in many cases, more time must pass before a clear cause of the pain can be found. Before that point, your health care provider may not know if you need more testing or further treatment. HOME CARE INSTRUCTIONS  Monitor your abdominal pain for any changes. The following actions may help to alleviate any discomfort you are experiencing:  Only take over-the-counter or prescription medicines as directed by your health care provider.  Do not take laxatives unless directed to do so by your health care provider.  Try a clear liquid diet (broth, tea, or water) as directed by your health care provider. Slowly move to a bland diet as tolerated. SEEK MEDICAL CARE IF:  You have unexplained abdominal pain.  You have abdominal pain associated with nausea or diarrhea.  You have pain when you urinate or have a bowel movement.  You experience abdominal pain that wakes you in the night.  You have abdominal pain that is worsened or improved by eating food.  You have abdominal pain that is worsened with eating fatty foods. SEEK IMMEDIATE MEDICAL CARE IF:   Your pain does not go away within 2 hours.  You have a fever.  You keep throwing up (vomiting).  Your pain is felt only in portions of the abdomen, such as the right side or the left lower portion of the abdomen.  You pass bloody or black tarry stools. MAKE SURE YOU:  Understand these instructions.   Will watch your  condition.   Will get help right away if you are not doing well or get worse.  Document Released: 10/02/2004 Document Revised: 10/13/2012 Document Reviewed: 09/01/2012 [facility] Patient Information 2014 [place].  Flank Pain Flank pain is pain in your side. The flank is the area of your side between your upper belly (abdomen) and your back. Pain in this area can be caused by many different things. [place] care and treatment will depend on the cause of your pain.  Rest as told by your doctor.  Drink enough fluids to keep your pee (urine) clear or pale yellow.  Only take medicine as told by your doctor.  Tell your doctor about any changes in your pain.  Follow up with your doctor. GET HELP RIGHT AWAY IF:   Your pain does not get better with medicine.   You have new symptoms or your symptoms get worse.  Your pain gets worse.   You have belly (abdominal) pain.   You are short of breath.   You always feel sick to your stomach (nauseous).   You keep throwing up (vomiting).   You have puffiness (swelling) in your belly.   You feel lightheaded or you pass out (faint).   You have blood in your pee.  You have a fever or lasting symptoms for more than 2 3 days.  You have a fever and your symptoms suddenly get worse. MAKE SURE YOU:   Understand these instructions.  Will watch your condition.  Will get help right away if you are not doing well or get worse. Document Released: 10/02/2007 Document Revised: 09/17/2011 Document Reviewed: 08/07/2011 [facility] Patient Information 2014 [place].

## 2013-01-28 NOTE — ED Notes (Signed)
Pt states he has been having pain in his left flank and testicles for around ten days with increasing pain today.

## 2013-01-28 NOTE — ED Provider Notes (Signed)
CSN: [id number]     Arrival date & time 01/27/13  2342 History   First MD Initiated Contact with Patient 01/28/13 0057     Chief Complaint  Patient presents with  . Flank Pain   (Consider location/radiation/quality/duration/timing/severity/associated sxs/prior Treatment) HPI 28 year old male presents to emergency room from home with complaint of left lower quadrant pain.  Area.  Patient reports he has had left-sided abdominal pain ongoing for "a while".  Over the last 10 days he has noticed slightly worsening of this pain.   Yesterday pain became acute.  He denies fever, chills, nausea, vomiting, or diarrhea.  No dysuria.  Pain starts in left upper quadrant, goes to left lower quadrant, and into his left testicle.  No prior history of kidney stone. Past Medical History  Diagnosis Date  . Bipolar disorder   . ADHD (attention deficit hyperactivity disorder)   . Schizophrenia   . Depression   . Leukemia    Past Surgical History  Procedure Laterality Date  . Bone marrow transplant     No family history on file. History  Substance Use Topics  . Smoking status: Current Every Day Smoker -- 0.50 packs/day    Types: Cigarettes  . Smokeless tobacco: Not on file  . Alcohol Use: Yes     Comment: rarely    Review of Systems  See History of Present Illness; otherwise all other systems are reviewed and negative Allergies  Aspirin  Home Medications   Current Outpatient Rx  Name  Route  Sig  Dispense  Refill  . ibuprofen (ADVIL,MOTRIN) 200 MG tablet   Oral   Take 400 mg by mouth every 6 (six) hours as needed for fever, headache, mild pain or moderate pain.         Marland Kitchen EXPIRED: OXcarbazepine (TRILEPTAL) 150 MG tablet   Oral   Take 1 tablet (150 mg total) by mouth 2 (two) times daily in the am and at bedtime.. For mood stabilization.   60 tablet   0   . EXPIRED: risperiDONE (RISPERDAL) 2 MG tablet   Oral   Take 1 tablet (2 mg total) by mouth at bedtime. For mental clarity.   30  tablet   0   . EXPIRED: sertraline (ZOLOFT) 25 MG tablet   Oral   Take 3 tablets (75 mg total) by mouth daily. For anxiety and depression.   90 tablet   0   . EXPIRED: traZODone (DESYREL) 100 MG tablet   Oral   Take 1 tablet (100 mg total) by mouth at bedtime. For sleep.   30 tablet   0    BP 127/72  Pulse 64  Temp(Src) 98.5 F (36.9 C) (Oral)  Resp 18  Ht 5\' 8"  (1.727 m)  Wt 217 lb (98.431 kg)  BMI 33.00 kg/m2  SpO2 98% Physical Exam  Nursing note and vitals reviewed. Constitutional: He is oriented to person, place, and time. He appears well-developed and well-nourished. No distress.  HENT:  Head: Normocephalic and atraumatic.  Right Ear: External ear normal.  Left Ear: External ear normal.  Nose: Nose normal.  Mouth/Throat: Oropharynx is clear and moist.  Eyes: Conjunctivae and EOM are normal. Pupils are equal, round, and reactive to light.  Neck: Normal range of motion. Neck supple. No JVD present. No tracheal deviation present. No thyromegaly present.  Cardiovascular: Normal rate, regular rhythm, normal heart sounds and intact distal pulses.  Exam reveals no gallop and no friction rub.   No murmur heard. Pulmonary/Chest: Effort normal  and breath sounds normal. No stridor. No respiratory distress. He has no wheezes. He has no rales. He exhibits no tenderness.  Abdominal: Soft. Bowel sounds are normal. He exhibits no distension and no mass. There is tenderness (tenderness in left abdomen worse in left lower quadrant). There is no rebound and no guarding.  Musculoskeletal: Normal range of motion. He exhibits no edema and no tenderness.  Lymphadenopathy:    He has no cervical adenopathy.  Neurological: He is alert and oriented to person, place, and time. He exhibits normal muscle tone. Coordination normal.  Skin: Skin is warm and dry. No rash noted. No erythema. No pallor.  Psychiatric: He has a normal mood and affect. His behavior is normal. Judgment and thought content  normal.    ED Course  Procedures (including critical care time) Labs Review Labs Reviewed  CBC WITH DIFFERENTIAL - Abnormal; Notable for the following:    WBC 11.9 (*)    Lymphs Abs 5.4 (*)    All other components within normal limits  COMPREHENSIVE METABOLIC PANEL - Abnormal; Notable for the following:    ALT 75 (*)    Total Bilirubin <0.2 (*)    All other components within normal limits  LIPASE, BLOOD  URINALYSIS, ROUTINE W REFLEX MICROSCOPIC   Imaging Review Ct Abdomen Pelvis W Contrast  01/28/2013   CLINICAL DATA:  Left upper quadrant abdominal pain, radiating to the left testis. Nausea.  EXAM: CT ABDOMEN AND PELVIS WITH CONTRAST  TECHNIQUE: Multidetector CT imaging of the abdomen and pelvis was performed using the standard protocol following bolus administration of intravenous contrast.  CONTRAST:  173mL OMNIPAQUE IOHEXOL 300 MG/ML  SOLN  COMPARISON:  None.  FINDINGS: Minimal bibasilar atelectasis is noted.  The liver and spleen are unremarkable in appearance. The gallbladder is within normal limits. The pancreas and adrenal glands are unremarkable.  The kidneys are unremarkable in appearance. There is no evidence of hydronephrosis. No renal or ureteral stones are seen. No perinephric stranding is appreciated.  No free fluid is identified. The small bowel is unremarkable in appearance. The stomach is within normal limits. No acute vascular abnormalities are seen.  The appendix is normal in caliber and contains air, without evidence for appendicitis. The colon is unremarkable in appearance.  The bladder is mildly distended and grossly unremarkable. The prostate is normal in size. No inguinal lymphadenopathy is seen.  No acute osseous abnormalities are identified.  IMPRESSION: Unremarkable contrast-enhanced CT of the abdomen and pelvis.   Electronically Signed   By: Garald Balding M.D.   On: 01/28/2013 03:32    EKG Interpretation   None       MDM   1. Abdominal pain    28 year old  male with left-sided abdominal pain.  Urine without signs of blood to indicate kidney stone.  Patient has anterior abdominal pain with palpation.  Concern for possible colitis as source of pain.  Plan for CT abdomen pelvis    Kalman Drape, MD 01/28/13 [id number]

## 2013-01-28 NOTE — ED Notes (Addendum)
Patient presents with c/o left flank that originally started in the gallbladder area.  Pain travels around to the left side and into the left testicle.  Stated the pain started about 9-10 days ago.  Stated that the pain was worse after eating.  Denies painful urination. States the pain goes away when his girlfriend "rubs the tube" and it makes it feel like the pressure is relieved.

## 2014-05-16 ENCOUNTER — Other Ambulatory Visit (HOSPITAL_COMMUNITY)
Admission: RE | Admit: 2014-05-16 | Discharge: 2014-05-16 | Disposition: A | Discharge status: Home / Self Care | Payer: Medicaid Other | Source: Ambulatory Visit | Attending: Family Medicine | Admitting: Family Medicine

## 2014-05-16 ENCOUNTER — Encounter (HOSPITAL_COMMUNITY): Payer: Self-pay | Admitting: *Deleted

## 2014-05-16 ENCOUNTER — Emergency Department (INDEPENDENT_AMBULATORY_CARE_PROVIDER_SITE_OTHER)
Admission: EM | Admit: 2014-05-16 | Discharge: 2014-05-16 | Disposition: A | Discharge status: Home / Self Care | Payer: Medicaid Other | Source: Home / Self Care | Attending: Family Medicine | Admitting: Family Medicine

## 2014-05-16 DIAGNOSIS — Z113 Encounter for screening for infections with a predominantly sexual mode of transmission: Secondary | ICD-10-CM | POA: Insufficient documentation

## 2014-05-16 DIAGNOSIS — N400 Enlarged prostate without lower urinary tract symptoms: Secondary | ICD-10-CM

## 2014-05-16 LAB — POCT URINALYSIS DIP (DEVICE)
BILIRUBIN URINE: NEGATIVE
GLUCOSE, UA: NEGATIVE mg/dL
Hgb urine dipstick: NEGATIVE
KETONES UR: NEGATIVE mg/dL
LEUKOCYTES UA: NEGATIVE
NITRITE: NEGATIVE
PH: 6 (ref 5.0–8.0)
Protein, ur: NEGATIVE mg/dL
Specific Gravity, Urine: >=1.03 (ref 1.005–1.030)
Urobilinogen, UA: 0.2 mg/dL (ref 0.0–1.0)

## 2014-05-16 MED ORDER — DOXYCYCLINE HYCLATE 100 MG PO CAPS: 100.0000 mg | ORAL_CAPSULE | Freq: Two times a day (BID) | ORAL | Status: AC

## 2014-05-16 NOTE — ED Provider Notes (Signed)
CSN: [id number]     Arrival date & time 05/16/14  0900 History   First MD Initiated Contact with Patient 05/16/14 1026     Chief Complaint  Patient presents with  . Testicle Pain   (Consider location/radiation/quality/duration/timing/severity/associated sxs/prior Treatment) Patient is a 29 y.o. male presenting with male genitourinary complaint. The history is provided by the patient.  Male GU Problem Presenting symptoms: scrotal pain   Presenting symptoms: no dysuria and no penile discharge   Relieved by: masturbation. Associated symptoms: no flank pain, no genital itching, no genital lesions, no hematuria, no scrotal swelling and no urinary frequency   Risk factors comment:  No sex for sev mos, relief with masturbation. no blood or fever.   Past Medical History  Diagnosis Date  . Bipolar disorder   . ADHD (attention deficit hyperactivity disorder)   . Schizophrenia   . Depression   . Leukemia    Past Surgical History  Procedure Laterality Date  . Bone marrow transplant     History reviewed. No pertinent family history. History  Substance Use Topics  . Smoking status: Current Every Day Smoker -- 0.50 packs/day    Types: Cigarettes  . Smokeless tobacco: Not on file  . Alcohol Use: Yes     Comment: rarely    Review of Systems  Constitutional: Negative.   Gastrointestinal: Negative.   Genitourinary: Positive for urgency and testicular pain. Negative for dysuria, frequency, hematuria, flank pain, discharge and scrotal swelling.    Allergies  Aspirin  Home Medications   Prior to Admission medications   Medication Sig Start Date End Date Taking? Authorizing Provider  dicyclomine (BENTYL) 20 MG tablet Take 1 tablet (20 mg total) by mouth every 6 (six) hours as needed for spasms (for abdominal cramping). 01/28/13   Linton Flemings, MD  doxycycline (VIBRAMYCIN) 100 MG capsule Take 1 capsule (100 mg total) by mouth 2 (two) times daily. 05/16/14   Billy Fischer, MD  ibuprofen  (ADVIL,MOTRIN) 200 MG tablet Take 400 mg by mouth every 6 (six) hours as needed for fever, headache, mild pain or moderate pain.    Historical Provider, MD  OXcarbazepine (TRILEPTAL) 150 MG tablet Take 1 tablet (150 mg total) by mouth 2 (two) times daily in the am and at bedtime.. For mood stabilization. 05/09/11 05/08/12  Ruben Im, PA-C  risperiDONE (RISPERDAL) 2 MG tablet Take 1 tablet (2 mg total) by mouth at bedtime. For mental clarity. 05/09/11 06/08/11  Ruben Im, PA-C  sertraline (ZOLOFT) 25 MG tablet Take 3 tablets (75 mg total) by mouth daily. For anxiety and depression. 05/09/11 05/08/12  Ruben Im, PA-C  traZODone (DESYREL) 100 MG tablet Take 1 tablet (100 mg total) by mouth at bedtime. For sleep. 05/09/11 06/08/11  Milta Deiters T Mashburn, PA-C   BP 130/70 mmHg  Pulse 78  Temp(Src) 98.6 F (37 C) (Oral)  Resp 18  SpO2 100% Physical Exam  Constitutional: He appears well-developed and well-nourished. No distress.  Abdominal: Soft. Bowel sounds are normal. Hernia confirmed negative in the right inguinal area and confirmed negative in the left inguinal area.  Genitourinary: Rectum normal, prostate normal, testes normal and penis normal. Right testis shows no tenderness. Left testis shows no tenderness. Circumcised.  Lymphadenopathy:       Right: No inguinal adenopathy present.       Left: No inguinal adenopathy present.  Neurological: He is alert.  Skin: Skin is warm.  Nursing note and vitals reviewed.   ED Course  Procedures (  including critical care time) Labs Review Labs Reviewed  POCT URINALYSIS DIP (DEVICE)  CYTOLOGY, (ORAL, ANAL, URETHRAL) ANCILLARY ONLY    Imaging Review No results found.   MDM   1. Prostatism        Billy Fischer, MD 05/16/14 1045

## 2014-05-16 NOTE — ED Notes (Signed)
Pt  Reports     Testicular  Pain            X   1 year               Worse  Yesterday               Pain is worse           l  Testicle                  denys  Any      Discharge                  Or  [place]

## 2014-05-17 LAB — CYTOLOGY, (ORAL, ANAL, URETHRAL) ANCILLARY ONLY
Chlamydia: NEGATIVE
Neisseria Gonorrhea: NEGATIVE

## 2014-05-19 NOTE — ED Notes (Signed)
Final report of STD screening negative, no further action required

## 2014-09-28 ENCOUNTER — Encounter (HOSPITAL_COMMUNITY): Payer: Self-pay | Admitting: Emergency Medicine

## 2014-09-28 ENCOUNTER — Emergency Department (INDEPENDENT_AMBULATORY_CARE_PROVIDER_SITE_OTHER)
Admission: EM | Admit: 2014-09-28 | Discharge: 2014-09-28 | Disposition: A | Discharge status: Home / Self Care | Payer: Medicaid Other | Source: Home / Self Care | Attending: Family Medicine | Admitting: Family Medicine

## 2014-09-28 DIAGNOSIS — H9201 Otalgia, right ear: Secondary | ICD-10-CM | POA: Diagnosis not present

## 2014-09-28 MED ORDER — NEOMYCIN-POLYMYXIN-HC 3.5-10000-1 OT SUSP: 4.0000 [drp] | Freq: Three times a day (TID) | OTIC | Status: AC

## 2014-09-28 NOTE — Discharge Instructions (Signed)
Use ear drops as prescribed and see dr Anthony Fischer if further problems

## 2014-09-28 NOTE — ED Provider Notes (Signed)
CSN: [id number]     Arrival date & time 09/28/14  1301 History   First MD Initiated Contact with Patient 09/28/14 1315     Chief Complaint  Patient presents with  . Otalgia   (Consider location/radiation/quality/duration/timing/severity/associated sxs/prior Treatment) Patient is a 29 y.o. male presenting with ear pain. The history is provided by the patient.  Otalgia Location:  Right Behind ear:  No abnormality Quality:  Dull and sore Severity:  Mild Onset quality:  Gradual Duration:  6 months Progression:  Unchanged Chronicity:  Chronic Context comment:  Relating vague right facial heat developing 75mo after wisdom tooth extraction Associated symptoms: no ear discharge, no fever and no rhinorrhea     Past Medical History  Diagnosis Date  . Bipolar disorder   . ADHD (attention deficit hyperactivity disorder)   . Schizophrenia   . Depression   . Leukemia    Past Surgical History  Procedure Laterality Date  . Bone marrow transplant     History reviewed. No pertinent family history. Social History  Substance Use Topics  . Smoking status: Current Every Day Smoker -- 0.50 packs/day    Types: Cigarettes  . Smokeless tobacco: None  . Alcohol Use: Yes     Comment: rarely    Review of Systems  Constitutional: Negative.  Negative for fever.  HENT: Positive for ear pain. Negative for dental problem, ear discharge, facial swelling, rhinorrhea and trouble swallowing.   Respiratory: Negative.   All other systems reviewed and are negative.   Allergies  Aspirin  Home Medications   Prior to Admission medications   Medication Sig Start Date End Date Taking? Authorizing Provider  buPROPion (WELLBUTRIN XL) 300 MG 24 hr tablet Take 300 mg by mouth daily.   Yes Historical Provider, MD  carbamazepine (TEGRETOL) 200 MG tablet Take 200 mg by mouth 3 (three) times daily.   Yes Historical Provider, MD  risperiDONE (RISPERDAL) 1 MG tablet Take 1 mg by mouth at bedtime.   Yes Historical  Provider, MD  dicyclomine (BENTYL) 20 MG tablet Take 1 tablet (20 mg total) by mouth every 6 (six) hours as needed for spasms (for abdominal cramping). 01/28/13   Linton Flemings, MD  doxycycline (VIBRAMYCIN) 100 MG capsule Take 1 capsule (100 mg total) by mouth 2 (two) times daily. 05/16/14   Billy Fischer, MD  ibuprofen (ADVIL,MOTRIN) 200 MG tablet Take 400 mg by mouth every 6 (six) hours as needed for fever, headache, mild pain or moderate pain.    Historical Provider, MD  neomycin-polymyxin-hydrocortisone (CORTISPORIN) 3.5-10000-1 otic suspension Place 4 drops into the right ear 3 (three) times daily. 09/28/14   Billy Fischer, MD  OXcarbazepine (TRILEPTAL) 150 MG tablet Take 1 tablet (150 mg total) by mouth 2 (two) times daily in the am and at bedtime.. For mood stabilization. 05/09/11 05/08/12  Ruben Im, PA-C  risperiDONE (RISPERDAL) 2 MG tablet Take 1 tablet (2 mg total) by mouth at bedtime. For mental clarity. 05/09/11 06/08/11  Ruben Im, PA-C  sertraline (ZOLOFT) 25 MG tablet Take 3 tablets (75 mg total) by mouth daily. For anxiety and depression. 05/09/11 05/08/12  Ruben Im, PA-C  traZODone (DESYREL) 100 MG tablet Take 1 tablet (100 mg total) by mouth at bedtime. For sleep. 05/09/11 06/08/11  Ruben Im, PA-C   Meds Ordered and Administered this Visit  Medications - No data to display  BP 101/66 mmHg  Pulse 85  Temp(Src) 98.6 F (37 C) (Oral)  Resp 16  SpO2  99% No data found.   Physical Exam  Constitutional: He is oriented to person, place, and time. He appears well-developed and well-nourished. No distress.  HENT:  Head: Normocephalic.  Right Ear: External ear normal.  Left Ear: External ear normal.  Mouth/Throat: Oropharynx is clear and moist.  No tmj pain, no facial sts or tenderness.  Eyes: Pupils are equal, round, and reactive to light.  Neck: Normal range of motion. Neck supple.  Lymphadenopathy:    He has no cervical adenopathy.  Neurological: He is alert and  oriented to person, place, and time.  Skin: Skin is warm and dry.  Nursing note and vitals reviewed.   ED Course  Procedures (including critical care time)  Labs Review Labs Reviewed - No data to display  Imaging Review No results found.   Visual Acuity Review  Right Eye Distance:   Left Eye Distance:   Bilateral Distance:    Right Eye Near:   Left Eye Near:    Bilateral Near:         MDM   1. Otalgia of right ear       Billy Fischer, MD 09/28/14 1349

## 2014-09-28 NOTE — ED Notes (Signed)
Pt has been suffering from pain in his right ear for 6 months.  He was put on antibiotics three months ago, but it did not help.  He describes the pain as a "radiating heat off my face".  Pt states he got this pain about 6 months after he had his wisdom teeth pulled.

## 2014-11-19 IMAGING — CT CT ABD-PELV W/ CM
2 of 4 series · 17 of 46 positions shown, 19 images · IV contrast (APPLIED)
Comparison: None.

CLINICAL DATA: Left upper quadrant abdominal pain, radiating to the
left testis. Nausea.

EXAM:
CT ABDOMEN AND PELVIS WITH CONTRAST
TECHNIQUE: Multidetector CT imaging of the abdomen and pelvis was performed
using the standard protocol following bolus administration of
intravenous contrast.
CONTRAST:  100mL OMNIPAQUE IOHEXOL 300 MG/ML  SOLN

[Series 2: abd/ pelvis 5.0 i30f 1 · axial · 0.84mm/px · z∈[+897,+1377]mm · 14 of 106 slices shown, 16 images]
[im 5/106  soft-tissue]
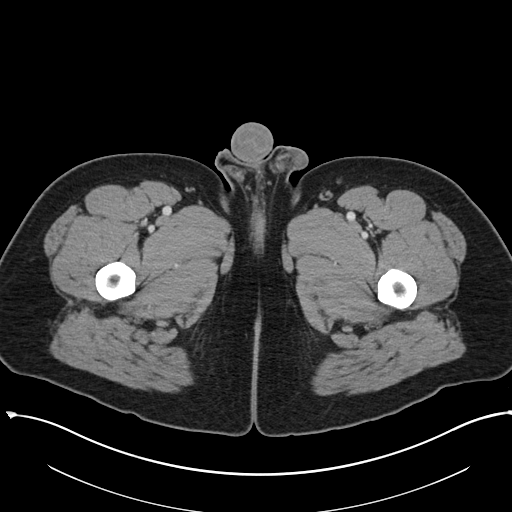
[im 5/106  bone]
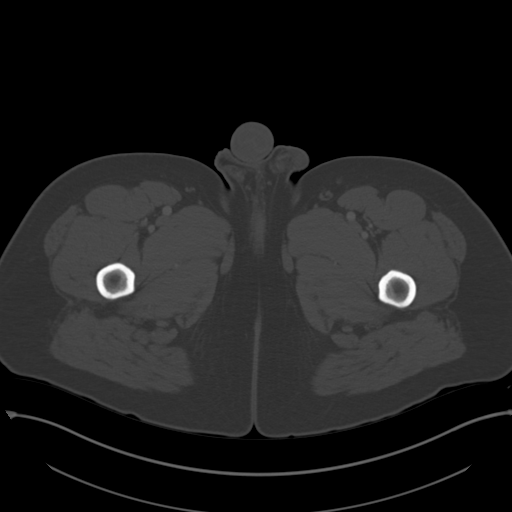
[im 14/106  soft-tissue]
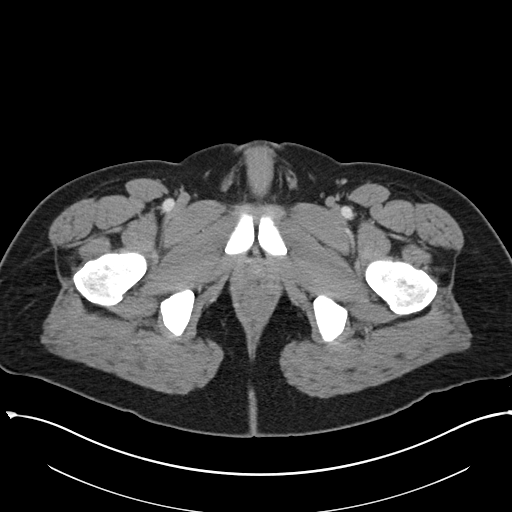
[im 19/106  soft-tissue]
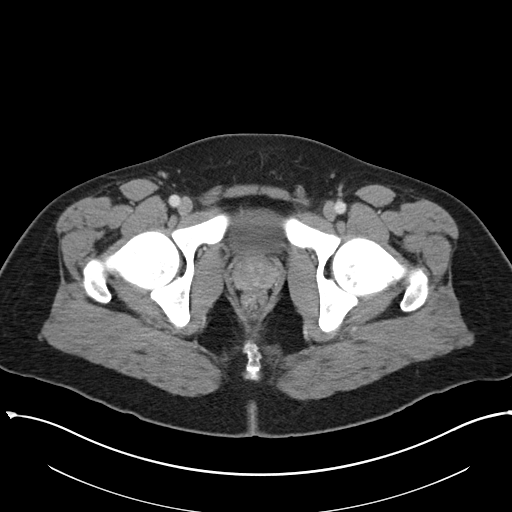
[im 28/106  soft-tissue]
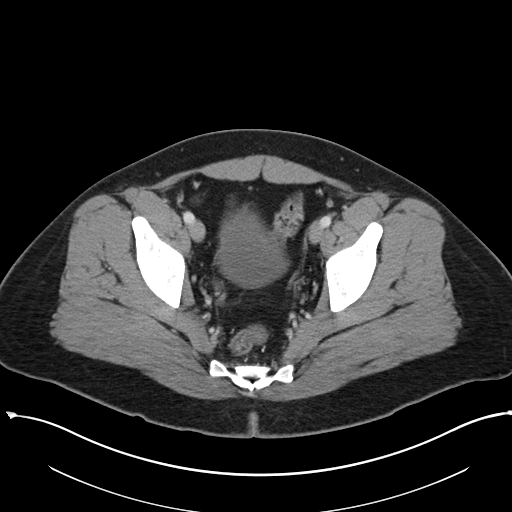
[im 37/106  soft-tissue]
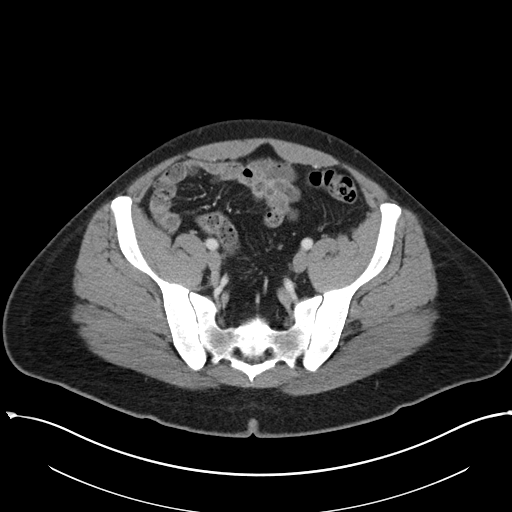
[im 42/106  soft-tissue]
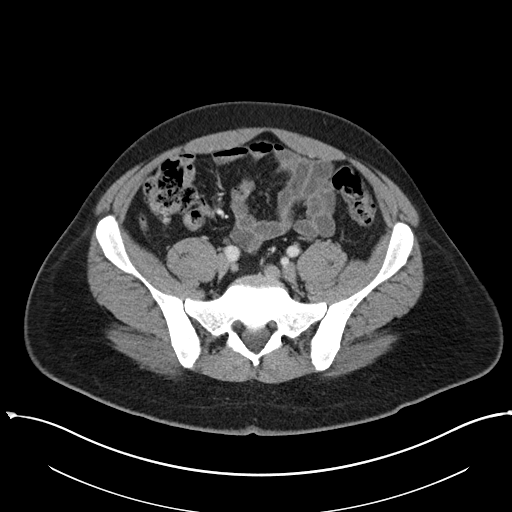
[im 51/106  soft-tissue]
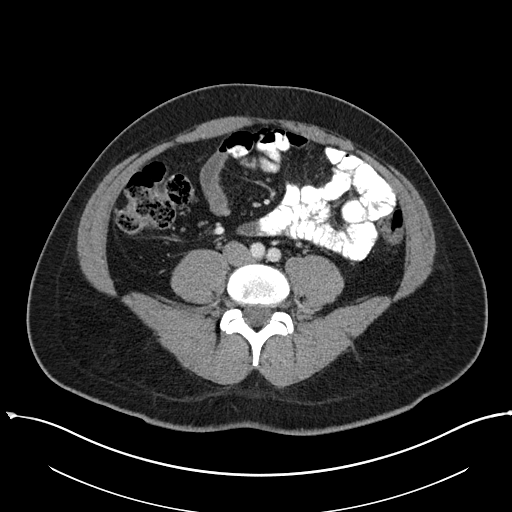
[im 55/106  soft-tissue]
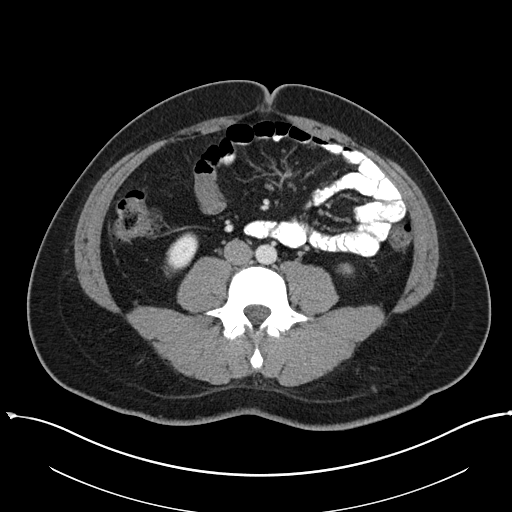
[im 64/106  soft-tissue]
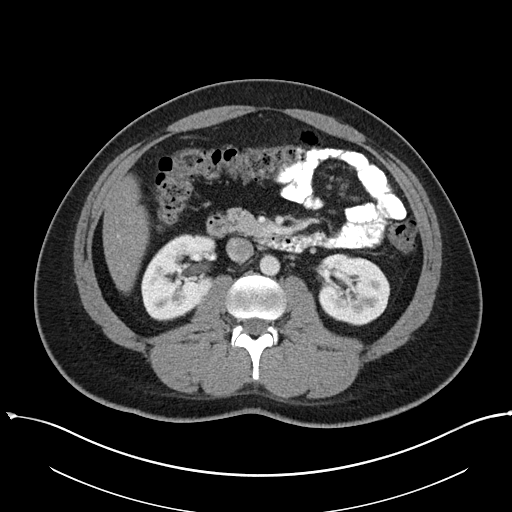
[im 64/106  bone]
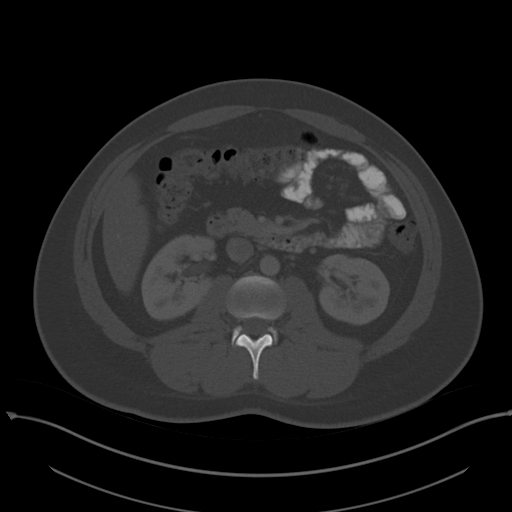
[im 69/106  soft-tissue]
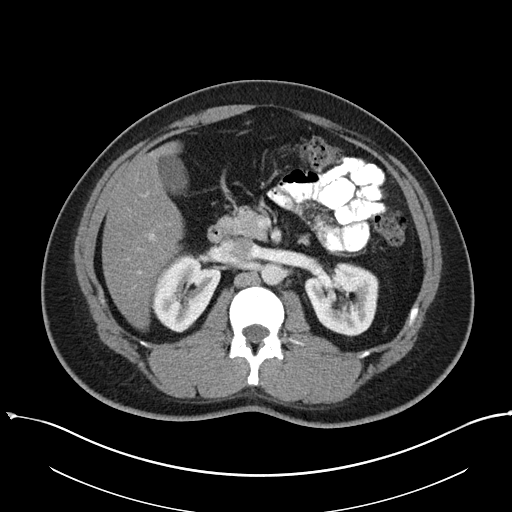
[im 78/106  soft-tissue]
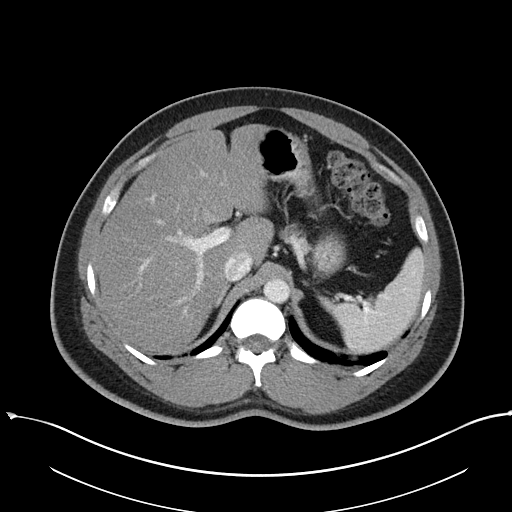
[im 87/106  soft-tissue]
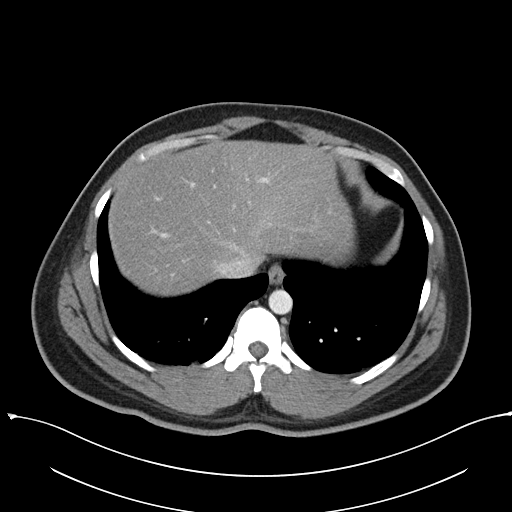
[im 92/106  soft-tissue]
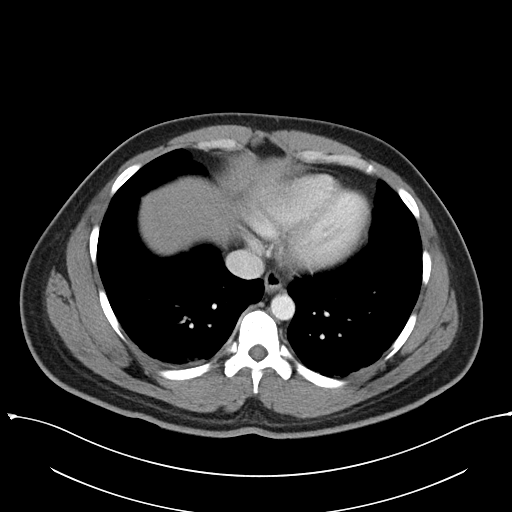
[im 101/106  soft-tissue]
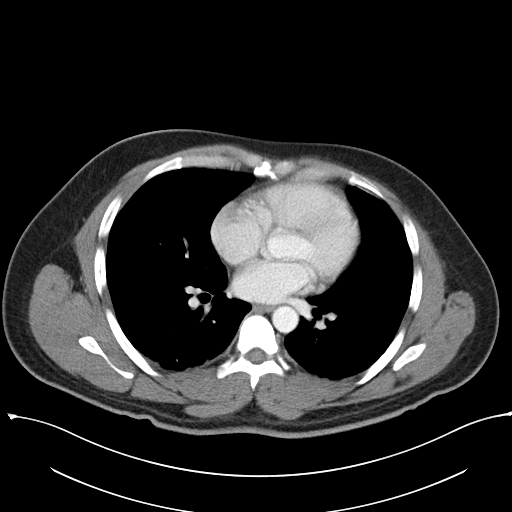

[Series 5: coronal soft tissue · coronal · 1.00mm/px · 3 of 96 slices shown]
[im 32/96  soft-tissue]
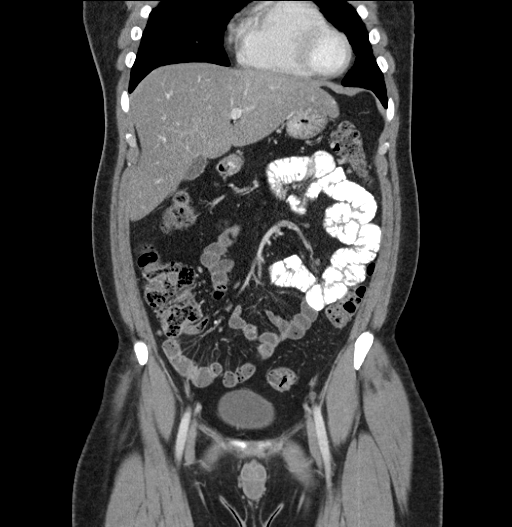
[im 43/96  soft-tissue]
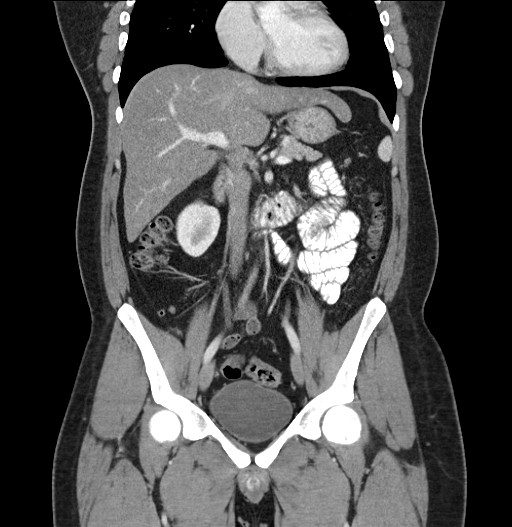
[im 53/96  soft-tissue]
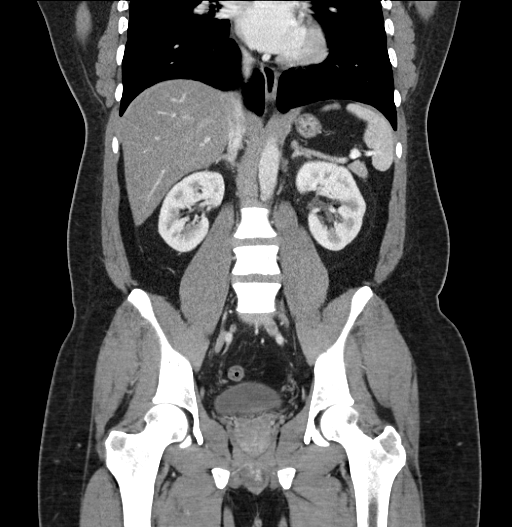

[17 of 46 positions shown; findings below may reference images not displayed]

FINDINGS: Minimal bibasilar atelectasis is noted.

The liver and spleen are unremarkable in appearance. The gallbladder
is within normal limits. The pancreas and adrenal glands are
unremarkable.

The kidneys are unremarkable in appearance. There is no evidence of
hydronephrosis. No renal or ureteral stones are seen. No perinephric
stranding is appreciated.

No free fluid is identified. The small bowel is unremarkable in
appearance. The stomach is within normal limits. No acute vascular
abnormalities are seen.

The appendix is normal in caliber and contains air, without evidence
for appendicitis. The colon is unremarkable in appearance.

The bladder is mildly distended and grossly unremarkable. The
prostate is normal in size. No inguinal lymphadenopathy is seen.

No acute osseous abnormalities are identified.
IMPRESSION: Unremarkable contrast-enhanced CT of the abdomen and pelvis.
